# Patient Record
Sex: Female | Born: 1995 | Race: White | Hispanic: No | Marital: Single | Smoking: Never smoker
Health system: Southern US, Community
[De-identification: ages and names within clinical notes are randomized; demographics above are authoritative.]

## PROBLEM LIST (undated history)

## (undated) DIAGNOSIS — R519 Headache, unspecified: Secondary | ICD-10-CM

## (undated) DIAGNOSIS — Z789 Other specified health status: Secondary | ICD-10-CM

## (undated) HISTORY — DX: Other specified health status: Z78.9

---
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
## 2017-02-10 ENCOUNTER — Other Ambulatory Visit: Payer: Self-pay | Admitting: Certified Nurse Midwife
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
## 2017-03-26 ENCOUNTER — Other Ambulatory Visit: Payer: Self-pay

## 2017-03-26 ENCOUNTER — Telehealth: Payer: Self-pay

## 2017-03-26 MED ORDER — NORETHIN ACE-ETH ESTRAD-FE 1-20 MG-MCG(24) PO TABS: 1.0000 | ORAL_TABLET | Freq: Every day | ORAL | 1 refills | Status: DC

## 2017-03-26 NOTE — Telephone Encounter (Signed)
Left detailed msg that bc refill eRx'd. 

## 2017-03-26 NOTE — Telephone Encounter (Signed)
Pt called triage line late yesterday afternoon stating she needs an annual appointment.   Huntley DecSara please call patient and schedule.

## 2017-03-26 NOTE — Telephone Encounter (Signed)
Pt is schedule 04/09/17 with JC

## 2017-04-09 ENCOUNTER — Ambulatory Visit (INDEPENDENT_AMBULATORY_CARE_PROVIDER_SITE_OTHER): Payer: BC Managed Care – PPO | Admitting: Maternal Newborn

## 2017-04-09 ENCOUNTER — Encounter: Payer: Self-pay | Admitting: Maternal Newborn

## 2017-04-09 VITALS — BP 92/56 | HR 97 | Ht 66.0 in | Wt 114.0 lb

## 2017-04-09 DIAGNOSIS — Z3041 Encounter for surveillance of contraceptive pills: Secondary | ICD-10-CM | POA: Diagnosis not present

## 2017-04-09 DIAGNOSIS — Z113 Encounter for screening for infections with a predominantly sexual mode of transmission: Secondary | ICD-10-CM | POA: Diagnosis not present

## 2017-04-09 DIAGNOSIS — Z01419 Encounter for gynecological examination (general) (routine) without abnormal findings: Secondary | ICD-10-CM | POA: Diagnosis not present

## 2017-04-09 MED ORDER — NORETHIN ACE-ETH ESTRAD-FE 1-20 MG-MCG(24) PO TABS: 1.0000 | ORAL_TABLET | Freq: Every day | ORAL | 12 refills | Status: DC

## 2017-04-09 NOTE — Progress Notes (Signed)
Gynecology Annual Exam  PCP: Ronnette JuniperPringle, Joseph, MD  Chief Complaint:  Chief Complaint  Patient presents with  . Gynecologic Exam    History of Present Illness: Patient is a 21 y.o. G0P0000 presents for annual exam. The patient has no complaints today.   LMP: Patient's last menstrual period was 03/07/2017 (exact date). Menarche:16 Average Interval: regular, 28-30 days Duration of flow: 4 days Heavy Menses: no Clots: no Intermenstrual Bleeding: no Postcoital Bleeding: no Dysmenorrhea: no  The patient is sexually active with one female partner. She currently uses OCP (estrogen/progesterone) for contraception. She has dyspareunia.  The patient does not perform self breast exams.  There is no notable family history of breast or ovarian cancer in her family.  The patient wears seatbelts: yes.  The patient has regular exercise: yes.    The patient denies current symptoms of depression.    Review of Systems  Constitutional: Negative for chills, fever and malaise/fatigue.  HENT: Negative for congestion, ear pain and sinus pain.   Eyes: Negative for blurred vision, double vision and redness.  Respiratory: Negative for cough, shortness of breath and wheezing.   Cardiovascular: Negative for chest pain and palpitations.  Gastrointestinal: Negative for abdominal pain, constipation, diarrhea, heartburn and nausea.  Genitourinary: Negative for dysuria, frequency and urgency.  Musculoskeletal: Negative.   Skin: Negative.   Neurological: Negative for dizziness, sensory change and headaches.  Endo/Heme/Allergies: Negative.   Psychiatric/Behavioral: Negative.   All other systems reviewed and are negative.   Past Medical History:  Past Medical History:  Diagnosis Date  . No known health problems     Past Surgical History:  Past Surgical History:  Procedure Laterality Date  . WISDOM TOOTH EXTRACTION  05/2013    Gynecologic History:  Patient's last menstrual period was 03/07/2017  (exact date). Contraception: OCP (estrogen/progesterone) Last Pap: Not indicated due to <19 years of age.  Obstetric History: G0P0000  Family History:  Family History  Problem Relation Age of Onset  . Diabetes Maternal Grandmother        TYPE 2    Social History:  Social History   Socioeconomic History  . Marital status: Single    Spouse name: Not on file  . Number of children: 0  . Years of education: Not on file  . Highest education level: Not on file  Social Needs  . Financial resource strain: Not on file  . Food insecurity - worry: Not on file  . Food insecurity - inability: Not on file  . Transportation needs - medical: Not on file  . Transportation needs - non-medical: Not on file  Occupational History  . Not on file  Tobacco Use  . Smoking status: Never Smoker  . Smokeless tobacco: Never Used  Substance and Sexual Activity  . Alcohol use: No    Frequency: Never  . Drug use: No  . Sexual activity: Yes    Birth control/protection: Pill  Other Topics Concern  . Not on file  Social History Narrative  . Not on file    Allergies:  No Known Allergies  Medications: Prior to Admission medications   Medication Sig Start Date End Date Taking? Authorizing Provider  Norethindrone Acetate-Ethinyl Estrad-FE (BLISOVI 24 FE) 1-20 MG-MCG(24) tablet Take 1 tablet daily by mouth. 03/26/17  Yes Copland, Ilona SorrelAlicia B, PA-C    Physical Exam Vitals: Blood pressure (!) 92/56, pulse 97, height 5\' 6"  (1.676 m), weight 114 lb (51.7 kg), last menstrual period 03/07/2017.  General: NAD HEENT: normocephalic, anicteric Thyroid:  no enlargement, no palpable nodules Pulmonary: No increased work of breathing, CTAB Cardiovascular: RRR, distal pulses 2+ Breast: Breast symmetrical, no tenderness, no palpable nodules or masses, no skin or nipple retraction present, no nipple discharge.  No axillary or supraclavicular lymphadenopathy. Abdomen: NABS, soft, non-tender, non-distended.  Umbilicus  without lesions.  No hepatomegaly, splenomegaly or masses palpable. No evidence of hernia  Genitourinary:  External: Normal external female genitalia.  Normal urethral meatus, normal  Bartholin's and Skene's glands.    Vagina: Normal vaginal mucosa, no evidence of prolapse.    Cervix: Grossly normal in appearance, no bleeding  Uterus: Non-enlarged, mobile, normal contour.  No CMT  Adnexa: ovaries non-enlarged, no adnexal masses  Rectal: deferred  Lymphatic: no evidence of inguinal lymphadenopathy Extremities: no edema, erythema, or tenderness Neurologic: Grossly intact Psychiatric: mood appropriate, affect full  Assessment: 21 y.o. G0P0000, routine annual exam . Plan: Problem List Items Addressed This Visit    None    Visit Diagnoses    Encounter for annual routine gynecological examination    -  Primary   Surveillance for birth control, oral contraceptives       Screening for STDs (sexually transmitted diseases)       Relevant Orders   Chlamydia/Gonococcus/Trichomonas, NAA   HEP, RPR, HIV Panel   HSV(herpes smplx)abs-1+2(IgG+IgM)-bld      1) Dyspareunia - [place] out [place]; discussed that this can be normal with infrequent intercourse and advised on measures to increase comfort.  2) STI screening was offered and accepted.  3) Pap not indicated due to patient's age.  4) Contraception - Patient is interested in continuing her current form of contraception(combination oral contraceptives), Rx provided and she is aware of side effects. Has very light cycle on pills.  5) Follow up 1 year for routine annual exam.  Marcelyn BruinsJacelyn Starlina Lapre, CNM 04/09/2017  3:02 PM

## 2017-04-11 LAB — HEP, RPR, HIV PANEL
HEP B S AG: NEGATIVE
HIV SCREEN 4TH GENERATION: NONREACTIVE
RPR: NONREACTIVE

## 2017-04-11 LAB — HSV(HERPES SMPLX)ABS-I+II(IGG+IGM)-BLD
HSV 1 Glycoprotein G Ab, IgG: <0.91 index (ref 0.00–0.90)
HSVI/II Comb IgM: 1.3 Ratio — ABNORMAL HIGH (ref 0.00–0.90)

## 2017-04-12 LAB — CHLAMYDIA/GONOCOCCUS/TRICHOMONAS, NAA
Chlamydia by NAA: NEGATIVE
GONOCOCCUS BY NAA: NEGATIVE
TRICH VAG BY NAA: NEGATIVE

## 2017-04-18 ENCOUNTER — Telehealth: Payer: Self-pay | Admitting: Maternal Newborn

## 2017-04-18 NOTE — Telephone Encounter (Signed)
Pt is calling about her lab results. Please advise. Pt also has questions about aniexty

## 2017-04-19 NOTE — Telephone Encounter (Signed)
Left message for patient to call back to explain results.  Thanks, [place] BingJaci

## 2017-04-23 NOTE — Telephone Encounter (Signed)
Pt's mother Stacy Montes calling to request blood work results. She stated that pt has called twice. Mom's cb# [phone] mom is on HIPPA form. Thank you.

## 2017-04-24 NOTE — Telephone Encounter (Signed)
Called her and left a message; please see if you can transfer her to [place] SinkRita if she calls back and maybe she can find out a better time for me to return her call. Thanks, [place] BingJaci

## 2017-04-24 NOTE — Telephone Encounter (Signed)
Patient's phone number was incorrect in Epic. Was able to speak with patient this morning to review results.  Thanks,  Stacy Montes

## 2017-04-24 NOTE — Telephone Encounter (Signed)
Pt is calling to speak with JC about her labs she has additional questions please advise

## 2017-05-01 NOTE — Telephone Encounter (Signed)
Called pt to see when a good time would be for Jaci to call her back.  Pt states she no longer needs Jaci to call her back.  She was just paranoid about a cold sore.

## 2017-06-27 IMAGING — MR MR KNEE*R* W/O CM
6 series · 37 of 40 positions shown · non-contrast
Comparison: None.

CLINICAL DATA: Right knee twisting injury playing soccer 2 days ago
with onset of pain. Initial encounter.

EXAM:
MRI OF THE RIGHT KNEE WITHOUT CONTRAST
TECHNIQUE: Multiplanar, multisequence MR imaging of the knee was performed. No
intravenous contrast was administered.

[Series 3: PD fat-sat · axial · 3.0mm · 0.50mm/px · z∈[-51,+62]mm · 9 of 35 slices shown (1 of 4)]
[im 1/35]
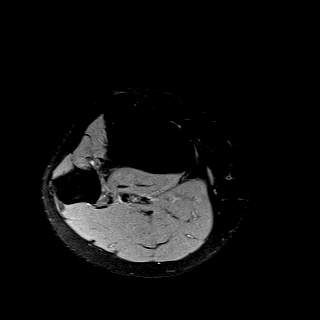
[im 5/35]
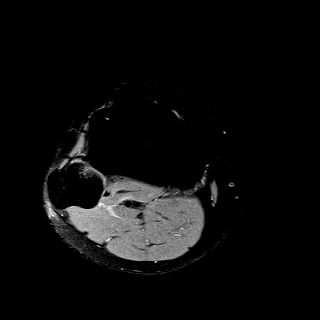
[im 9/35]
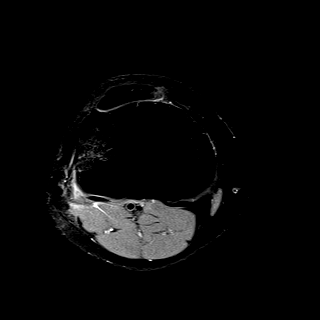
[im 13/35]
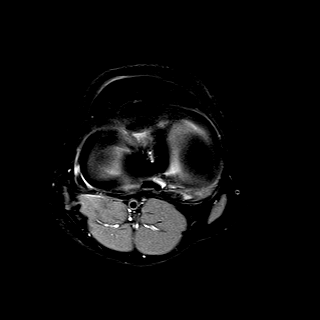
[im 18/35]
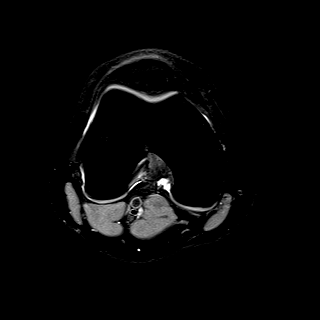
[im 22/35]
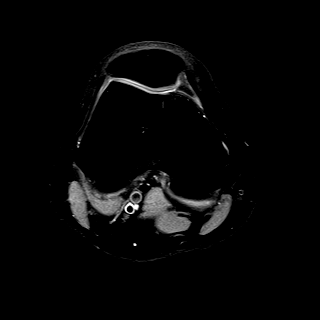
[im 26/35]
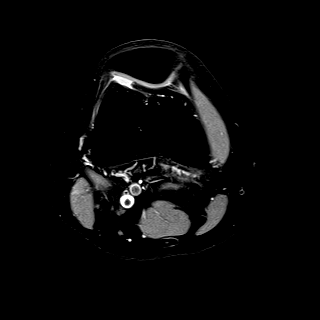
[im 30/35]
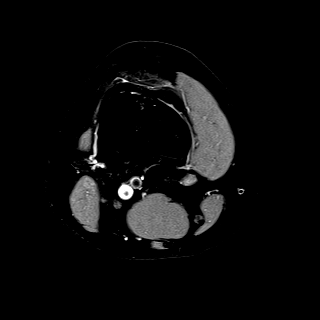
[im 35/35]
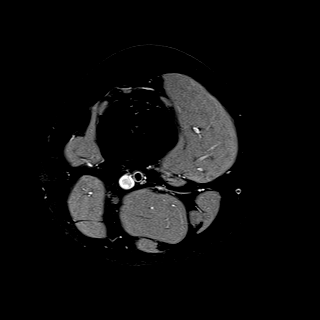

[Series 4: T1 · coronal · 3.0mm · 0.50mm/px · 4 of 27 slices shown]
[im 1/27]
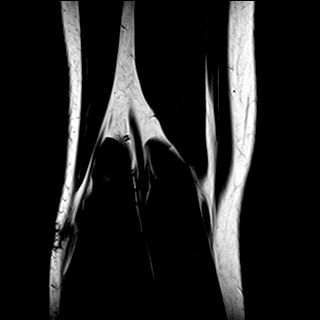
[im 5/27]
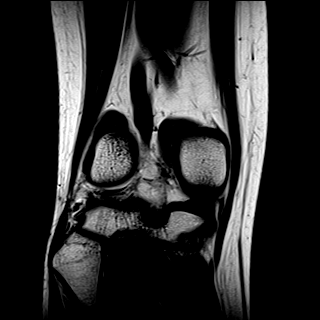
[im 9/27]
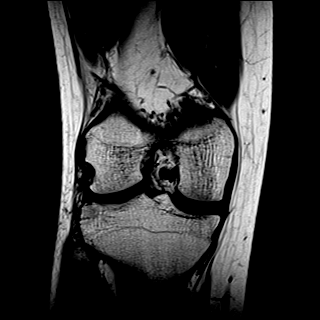
[im 14/27]
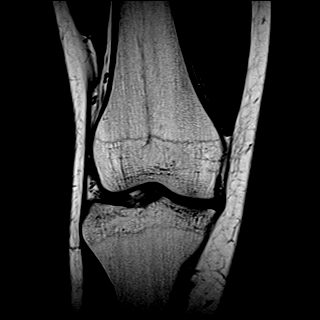

[Series 5: PD fat-sat · sagittal · 3.0mm · 0.50mm/px · 8 of 30 slices shown (2 of 4)]
[im 1/30]
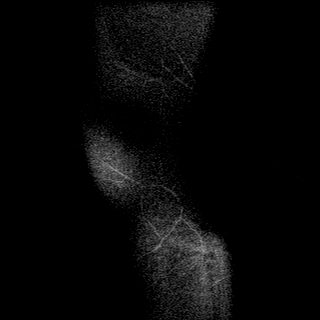
[im 5/30]
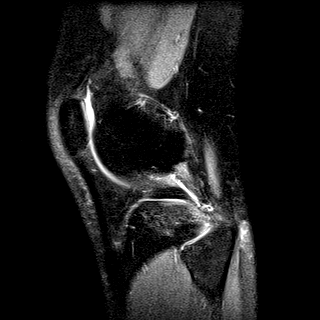
[im 9/30]
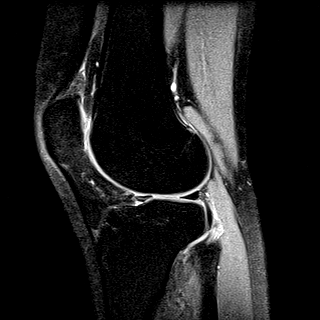
[im 13/30]
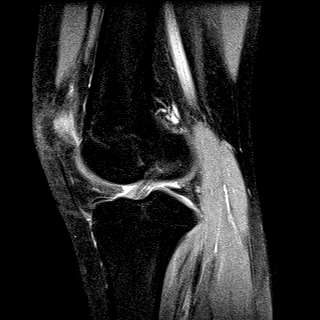
[im 17/30]
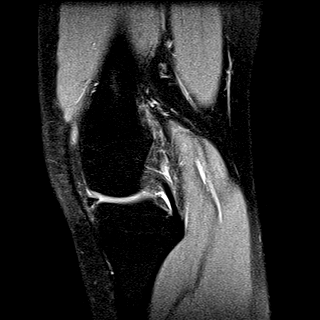
[im 21/30]
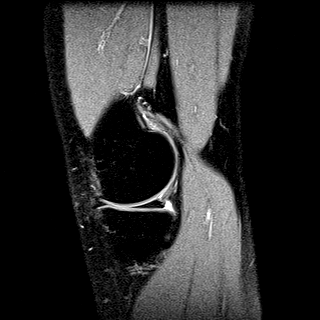
[im 25/30]
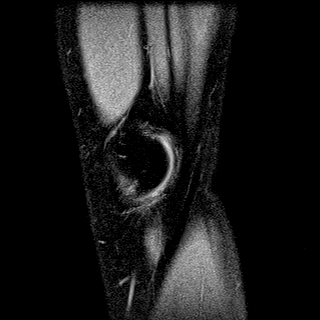
[im 30/30]
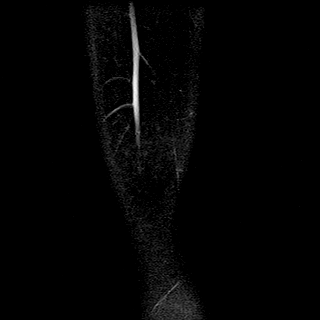

[Series 6: T2 fat-sat · coronal · 3.0mm · 0.31mm/px · 7 of 27 slices shown]
[im 1/27]
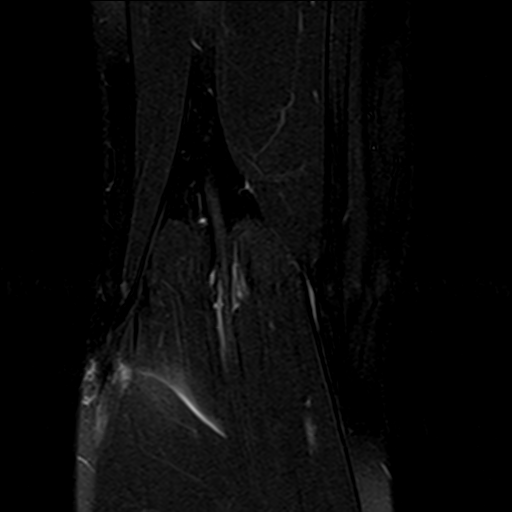
[im 5/27]
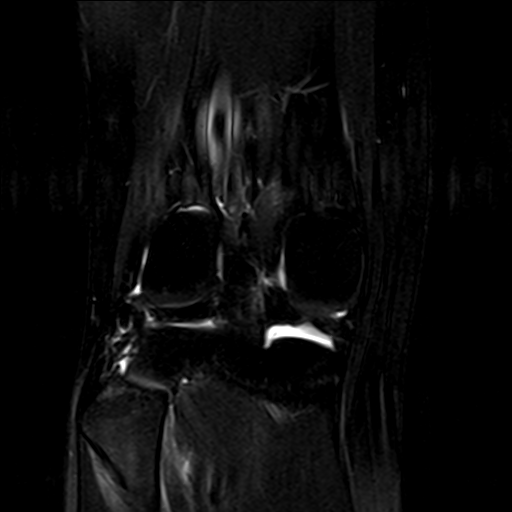
[im 9/27]
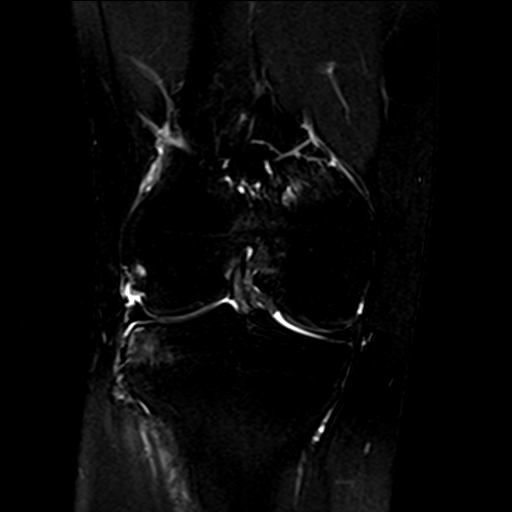
[im 14/27]
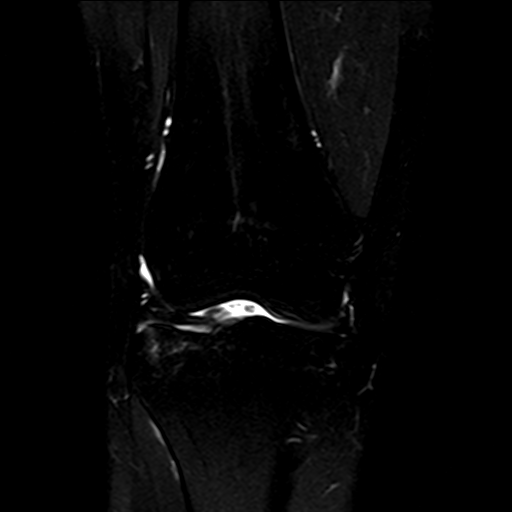
[im 18/27]
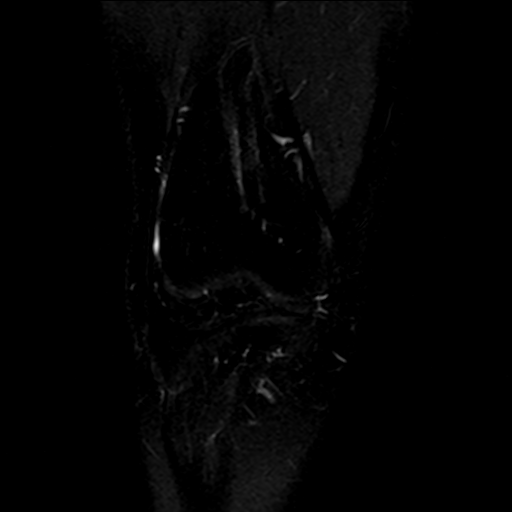
[im 22/27]
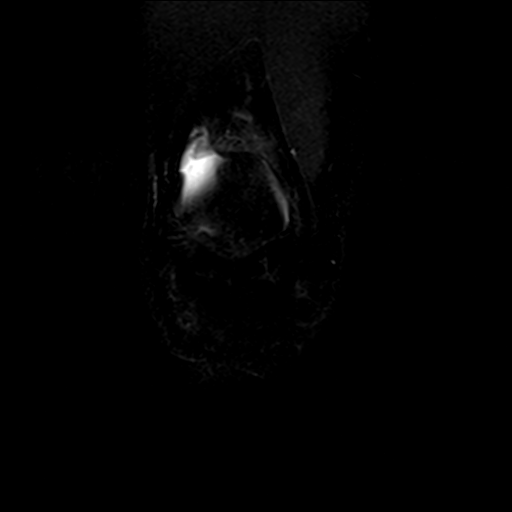
[im 27/27]
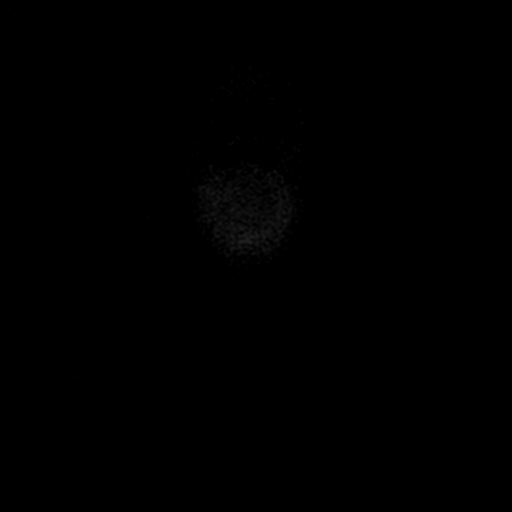

[Series 7: PD fat-sat · coronal · 3.0mm · 0.50mm/px · 7 of 27 slices shown (3 of 4)]
[im 1/27]
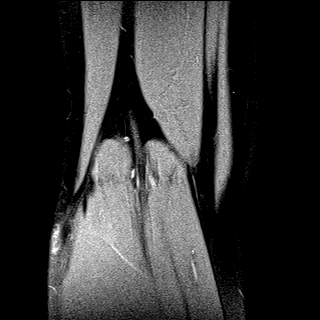
[im 5/27]
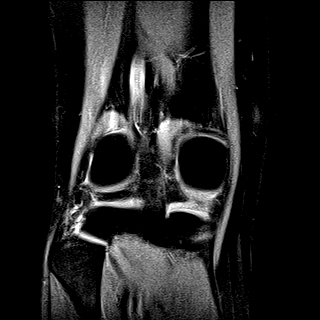
[im 9/27]
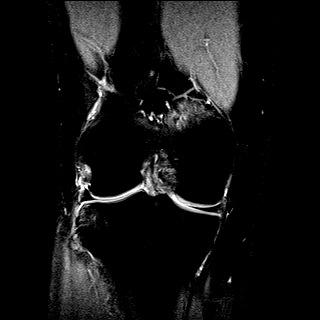
[im 14/27]
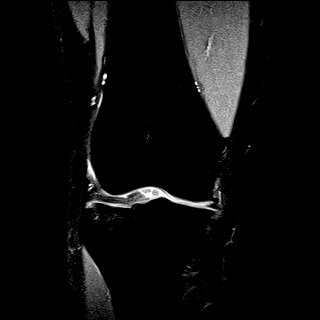
[im 18/27]
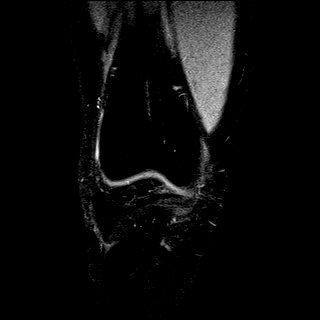
[im 22/27]
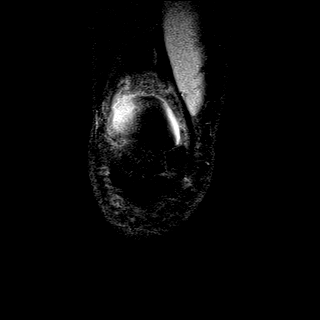
[im 27/27]
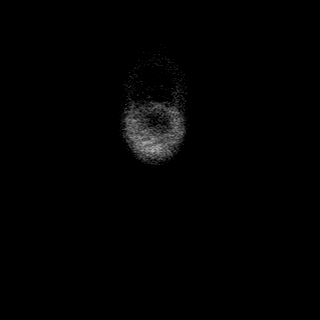

[Series 8: PD fat-sat · oblique · 2.0mm · 0.62mm/px · 2 of 7 slices shown (4 of 4)]
[im 1/7]
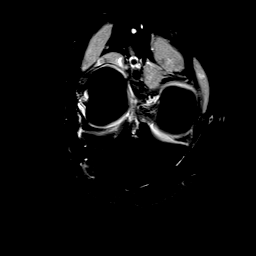
[im 7/7]
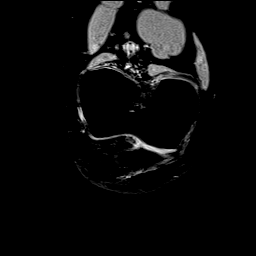

[37 of 40 positions shown; findings below may reference images not displayed]

FINDINGS: MENISCI

Medial meniscus:  Intact.

Lateral meniscus:  Intact.

LIGAMENTS

Cruciates:  Intact.

Collaterals:  Intact.

CARTILAGE

Patellofemoral:  Unremarkable.

Medial:  Unremarkable.

Lateral:  Unremarkable.

Joint:  Trace amount of joint fluid.

Popliteal Fossa:  No Baker's cyst.

Extensor Mechanism:  Intact.

Bones: Marrow edema is seen in the periphery of the lateral tibia
most consistent with bone contusion. No fracture is identified. Bone
marrow signal is otherwise normal.
IMPRESSION: Bone contusion lateral tibial plateau without fracture.

Negative for meniscal or ligament tear.

## 2018-01-23 ENCOUNTER — Telehealth: Payer: Self-pay

## 2018-01-23 NOTE — Telephone Encounter (Signed)
Patient is returning missed call. Please advise 

## 2018-01-23 NOTE — Telephone Encounter (Signed)
Adv to use backup until next period to be on the safe side.

## 2018-01-23 NOTE — Telephone Encounter (Signed)
Pt missed a pill in week 1 of pack, took it the next day, missed another pill on Tues of week 2.  Instructions say to use backup for 7d if missed consecutive days.  What to do?  [phone]  [facility].

## 2018-01-29 ENCOUNTER — Telehealth: Payer: Self-pay

## 2018-01-29 NOTE — Telephone Encounter (Signed)
Pt has been on Blisovi for 4-68yrs; c/o low sex drive. What to do?  [phone]

## 2018-01-31 NOTE — Telephone Encounter (Signed)
Pt calling back regarding decreased libido on birthcontrol. She said she called 2 days ago and no one has called her back. [phone] KJ CMA

## 2018-04-14 ENCOUNTER — Other Ambulatory Visit (HOSPITAL_COMMUNITY)
Admission: RE | Admit: 2018-04-14 | Discharge: 2018-04-14 | Disposition: A | Discharge status: Home / Self Care | Payer: BC Managed Care – PPO | Source: Ambulatory Visit | Attending: Maternal Newborn | Admitting: Maternal Newborn

## 2018-04-14 ENCOUNTER — Ambulatory Visit (INDEPENDENT_AMBULATORY_CARE_PROVIDER_SITE_OTHER): Payer: BC Managed Care – PPO | Admitting: Maternal Newborn

## 2018-04-14 ENCOUNTER — Encounter: Payer: Self-pay | Admitting: Maternal Newborn

## 2018-04-14 VITALS — BP 114/66 | HR 89 | Ht 66.5 in | Wt 116.0 lb

## 2018-04-14 DIAGNOSIS — Z113 Encounter for screening for infections with a predominantly sexual mode of transmission: Secondary | ICD-10-CM | POA: Insufficient documentation

## 2018-04-14 DIAGNOSIS — Z3041 Encounter for surveillance of contraceptive pills: Secondary | ICD-10-CM

## 2018-04-14 DIAGNOSIS — Z01419 Encounter for gynecological examination (general) (routine) without abnormal findings: Secondary | ICD-10-CM

## 2018-04-14 MED ORDER — NORETHIN ACE-ETH ESTRAD-FE 1-20 MG-MCG(24) PO TABS: ORAL_TABLET | ORAL | 11 refills | Status: DC

## 2018-04-14 NOTE — Progress Notes (Signed)
Gynecology Annual Exam  PCP: Ronnette JuniperPringle, Joseph, MD  Chief Complaint:  Chief Complaint  Patient presents with  . Gynecologic Exam    History of Present Illness: Patient is a 22 y.o. G0P0000 presenting for an annual exam. The patient has no complaints today.   LMP: No LMP recorded. (Menstrual status: Oral contraceptives). Menarche:16 Average Interval: irregular, has cycles every few months on OCP Duration of flow: a few days Heavy Menses: no Clots: no Intermenstrual Bleeding: no Postcoital Bleeding: no Dysmenorrhea: no  The patient is sexually active. She currently uses OCP for contraception. She has dyspareunia.  The patient does not perform self breast exams.  There is no notable family history of breast or ovarian cancer in her family.  The patient wears seatbelts: yes.  The patient has regular exercise: yes.    The patient denies current symptoms of depression.    Review of Systems  Constitutional: Positive for weight loss.  HENT: Positive for congestion and sore throat.   Eyes: Negative.   Respiratory: Positive for cough. Negative for shortness of breath and wheezing.   Cardiovascular: Negative for chest pain and palpitations.  Gastrointestinal: Negative for abdominal pain and heartburn.  Genitourinary: Negative.   Musculoskeletal: Negative.   Skin: Negative.   Neurological: Positive for dizziness.  Endo/Heme/Allergies: Positive for environmental allergies.  Psychiatric/Behavioral: Negative.   All other systems reviewed and are negative.   Past Medical History:  Past Medical History:  Diagnosis Date  . No known health problems     Past Surgical History:  Past Surgical History:  Procedure Laterality Date  . WISDOM TOOTH EXTRACTION  05/2013    Gynecologic History:  No LMP recorded. (Menstrual status: Oral contraceptives). Contraception: OCP (estrogen/progesterone) Last Pap: N/A, less than 20 years old.  Obstetric History: G0P0000  Family History:    Family History  Problem Relation Age of Onset  . Diabetes Maternal Grandmother        TYPE 2    Social History:  Social History   Socioeconomic History  . Marital status: Single    Spouse name: Not on file  . Number of children: 0  . Years of education: Not on file  . Highest education level: Not on file  Occupational History  . Not on file  Social Needs  . Financial resource strain: Not on file  . Food insecurity:    Worry: Not on file    Inability: Not on file  . Transportation needs:    Medical: Not on file    Non-medical: Not on file  Tobacco Use  . Smoking status: Never Smoker  . Smokeless tobacco: Never Used  Substance and Sexual Activity  . Alcohol use: No    Frequency: Never  . Drug use: No  . Sexual activity: Not Currently    Birth control/protection: Pill  Lifestyle  . Physical activity:    Days per week: 7 days    Minutes per session: 60 min  . Stress: Only a little  Relationships  . Social connections:    Talks on phone: More than three times a week    Gets together: More than three times a week    Attends religious service: More than 4 times per year    Active member of club or organization: Yes    Attends meetings of clubs or organizations: Never    Relationship status: Never married  . Intimate partner violence:    Fear of current or ex partner: No    Emotionally abused:  No    Physically abused: No    Forced sexual activity: No  Other Topics Concern  . Not on file  Social History Narrative  . Not on file    Allergies:  No Known Allergies  Medications: Prior to Admission medications   Medication Sig Start Date End Date Taking? Authorizing Provider  Norethindrone Acetate-Ethinyl Estrad-FE (BLISOVI 24 FE) 1-20 MG-MCG(24) tablet TAKE 1 TABLET BY MOUTH EVERY DAY .START THE FIRST PILL PACK ON THE FIRST DAY OF YOUR NEXT PERIOD. 10/17/16  Yes [provider]    Physical Exam Vitals: Blood pressure 114/66, pulse 89, height 5' 6.5"  (1.689 m), weight 116 lb (52.6 kg).  General: NAD HEENT: normocephalic, anicteric Thyroid: no enlargement Pulmonary: No increased work of breathing, CTAB Cardiovascular: RRR Breast: Breasts symmetrical, no tenderness, no palpable nodules or masses, no skin or nipple retraction present, no nipple discharge.  No axillary or supraclavicular lymphadenopathy. Abdomen: Soft, non-tender, non-distended.  Umbilicus without lesions.  No hepatomegaly, splenomegaly or masses palpable. No evidence of hernia  Genitourinary:  External: Normal external female genitalia.  Normal urethral  meatus, normal Bartholin's and Skene's glands.    Vagina: Normal vaginal mucosa, no evidence of prolapse.    Cervix: Grossly normal in appearance, no bleeding  Uterus: Non-enlarged, mobile, normal contour.  No CMT  Adnexa: ovaries non-enlarged, no adnexal masses  Rectal: deferred  Lymphatic: no evidence of inguinal lymphadenopathy Extremities: no edema, erythema, or tenderness Neurologic: Grossly intact Psychiatric: mood appropriate, affect full  Assessment: 22 y.o. G0P0000 routine annual exam  Plan: Problem List Items Addressed This Visit    None    Visit Diagnoses    Women's annual routine gynecological examination    -  Primary   Screen for STD (sexually transmitted disease)       Relevant Orders   Cervicovaginal ancillary only   HEP, RPR, HIV Panel   HSV(herpes smplx)abs-1+2(IgG+IgM)-bld      1) STI screening was offered and accepted  2) Begin Pap screening next year at age 51.  89) Contraception -  Patient is interested in continuing her current form of contraception. Occasional cycles with pills and satisfied overall with this method; no adverse effects.  4) Follow up 1 year for routine annual exam.  Marcelyn Bruins, CNM 04/14/2018  4:05 PM

## 2018-04-16 LAB — HSV(HERPES SMPLX)ABS-I+II(IGG+IGM)-BLD
HSV 1 Glycoprotein G Ab, IgG: <0.91 index (ref 0.00–0.90)
HSVI/II Comb IgM: 1.14 Ratio — ABNORMAL HIGH (ref 0.00–0.90)

## 2018-04-16 LAB — HEP, RPR, HIV PANEL
HIV Screen 4th Generation wRfx: NONREACTIVE
Hepatitis B Surface Ag: NEGATIVE
RPR: NONREACTIVE

## 2018-04-16 LAB — CERVICOVAGINAL ANCILLARY ONLY
CHLAMYDIA, DNA PROBE: NEGATIVE
NEISSERIA GONORRHEA: NEGATIVE
Trichomonas: NEGATIVE

## 2019-01-29 ENCOUNTER — Other Ambulatory Visit: Payer: Self-pay | Admitting: Maternal Newborn

## 2019-01-29 DIAGNOSIS — Z3041 Encounter for surveillance of contraceptive pills: Secondary | ICD-10-CM

## 2019-04-06 ENCOUNTER — Other Ambulatory Visit: Payer: Self-pay | Admitting: Maternal Newborn

## 2019-04-06 DIAGNOSIS — Z3041 Encounter for surveillance of contraceptive pills: Secondary | ICD-10-CM

## 2019-04-21 NOTE — Patient Instructions (Signed)
I value your feedback and entrusting us with your care. If you get a [facility] patient survey, I would appreciate you taking the time to let us know about your experience today. Thank you! 

## 2019-04-21 NOTE — Progress Notes (Signed)
PCP:  Ronnette JuniperPringle, Joseph, MD   Chief Complaint  Patient presents with  . Gynecologic Exam     HPI:      [name] is a 23 y.o. G0P0000 who LMP was Patient's last menstrual period was 04/13/2019 (approximate)., presents today for her annual examination.  Her menses are absent on OCPs.  Dysmenorrhea during placebo pills, no meds needed. She does have intermenstrual bleeding with late/missed pills.  Sex activity: not sexually active currently, but was in past with dyspareunia.  Last Pap: N/A due to age Hx of STDs: none  There is no FH of breast cancer. There is no FH of ovarian cancer. The patient does not do self-breast exams.  Tobacco use: The patient denies current or previous tobacco use. Alcohol use: social drinker No drug use.  Exercise: moderately active  She does get adequate calcium but not Vitamin D in her diet. Thinks she had Gardasil vaccine.   There are no active problems to display for this patient.   Past Surgical History:  Procedure Laterality Date  . WISDOM TOOTH EXTRACTION  05/2013    Family History  Problem Relation Age of Onset  . Diabetes Maternal Grandmother        TYPE 2    Social History   Socioeconomic History  . Marital status: Single    Spouse name: Not on file  . Number of children: 0  . Years of education: Not on file  . Highest education level: Not on file  Occupational History  . Not on file  Social Needs  . Financial resource strain: Not on file  . Food insecurity    Worry: Not on file    Inability: Not on file  . Transportation needs    Medical: Not on file    Non-medical: Not on file  Tobacco Use  . Smoking status: Never Smoker  . Smokeless tobacco: Never Used  Substance and Sexual Activity  . Alcohol use: No    Frequency: Never  . Drug use: No  . Sexual activity: Not Currently    Birth control/protection: Pill  Lifestyle  . Physical activity    Days per week: 7 days    Minutes per session: 60 min  .  Stress: Only a little  Relationships  . Social connections    Talks on phone: More than three times a week    Gets together: More than three times a week    Attends religious service: More than 4 times per year    Active member of club or organization: Yes    Attends meetings of clubs or organizations: Never    Relationship status: Never married  . Intimate partner violence    Fear of current or ex partner: No    Emotionally abused: No    Physically abused: No    Forced sexual activity: No  Other Topics Concern  . Not on file  Social History Narrative  . Not on file     Current Outpatient Medications:  .  mupirocin ointment (BACTROBAN) 2 %, APPLY A SMALL AMOUNT TO AFFECTED AREA ONCE A DAY WITH DRESSING CHANGES, Disp: , Rfl:  .  Norethindrone Acetate-Ethinyl Estrad-FE (BLISOVI 24 FE) 1-20 MG-MCG(24) tablet, TAKE 1 TABLET BY MOUTH DAILY, Disp: 84 tablet, Rfl: 3     ROS:  Review of Systems  Constitutional: Negative for fatigue, fever and unexpected weight change.  Respiratory: Negative for cough, shortness of breath and wheezing.   Cardiovascular: Negative for chest pain,  palpitations and leg swelling.  Gastrointestinal: Negative for blood in stool, constipation, diarrhea, nausea and vomiting.  Endocrine: Negative for cold intolerance, heat intolerance and polyuria.  Genitourinary: Negative for dyspareunia, dysuria, flank pain, frequency, genital sores, hematuria, menstrual problem, pelvic pain, urgency, vaginal bleeding, vaginal discharge and vaginal pain.  Musculoskeletal: Negative for back pain, joint swelling and myalgias.  Skin: Negative for rash.  Neurological: Negative for dizziness, syncope, light-headedness, numbness and headaches.  Hematological: Negative for adenopathy.  Psychiatric/Behavioral: Negative for agitation, confusion, sleep disturbance and suicidal ideas. The patient is not nervous/anxious.    BREAST: No symptoms   Objective: BP 102/70   Ht 5\' 7"   (1.702 m)   Wt 117 lb (53.1 kg)   LMP 04/13/2019 (Approximate)   BMI 18.32 kg/m    Physical Exam Constitutional:      Appearance: She is well-developed.  Genitourinary:     Vulva, vagina, cervix, uterus, right adnexa and left adnexa normal.     No vulval lesion or tenderness noted.     No vaginal discharge, erythema or tenderness.     No cervical polyp.     Uterus is not enlarged or tender.     No right or left adnexal mass present.     Right adnexa not tender.     Left adnexa not tender.  Neck:     Musculoskeletal: Normal range of motion.     Thyroid: No thyromegaly.  Cardiovascular:     Rate and Rhythm: Normal rate and regular rhythm.     Heart sounds: Normal heart sounds. No murmur.  Pulmonary:     Effort: Pulmonary effort is normal.     Breath sounds: Normal breath sounds.  Chest:     Breasts:        Right: No mass, nipple discharge, skin change or tenderness.        Left: No mass, nipple discharge, skin change or tenderness.  Abdominal:     Palpations: Abdomen is soft.     Tenderness: There is no abdominal tenderness. There is no guarding.  Musculoskeletal: Normal range of motion.  Neurological:     General: No focal deficit present.     Mental Status: She is alert and oriented to person, place, and time.     Cranial Nerves: No cranial nerve deficit.  Skin:    General: Skin is warm and dry.  Psychiatric:        Mood and Affect: Mood normal.        Behavior: Behavior normal.        Thought Content: Thought content normal.        Judgment: Judgment normal.  Vitals signs reviewed.     Assessment/Plan: Encounter for annual routine gynecological examination  Cervical cancer screening - Plan: [facility] PAP  Screening for STD (sexually transmitted disease) - Plan: [facility] PAP  Encounter for surveillance of contraceptive pills - Plan: Norethindrone Acetate-Ethinyl Estrad-FE (BLISOVI 24 FE) 1-20 MG-MCG(24) tablet; OCP RF    Meds ordered this encounter  Medications  .  Norethindrone Acetate-Ethinyl Estrad-FE (BLISOVI 24 FE) 1-20 MG-MCG(24) tablet    Sig: TAKE 1 TABLET BY MOUTH DAILY    Dispense:  84 tablet    Refill:  3    Order Specific Question:   Supervising Provider    Answer:   04/15/2019 Nadara Mustard             GYN counsel adequate intake of calcium and vitamin D, diet and exercise, Gardasil--pt to confirm it was  completed.      F/U  Return in about 1 year (around 04/21/2020).  Lynasia Meloche B. Jenniferlynn Saad, PA-C 04/22/2019 10:26 AM

## 2019-04-22 ENCOUNTER — Other Ambulatory Visit (HOSPITAL_COMMUNITY)
Admission: RE | Admit: 2019-04-22 | Discharge: 2019-04-22 | Disposition: A | Discharge status: Home / Self Care | Payer: BC Managed Care – PPO | Source: Ambulatory Visit | Attending: Obstetrics and Gynecology | Admitting: Obstetrics and Gynecology

## 2019-04-22 ENCOUNTER — Ambulatory Visit (INDEPENDENT_AMBULATORY_CARE_PROVIDER_SITE_OTHER): Payer: BC Managed Care – PPO | Admitting: Obstetrics and Gynecology

## 2019-04-22 ENCOUNTER — Other Ambulatory Visit: Payer: Self-pay

## 2019-04-22 ENCOUNTER — Ambulatory Visit: Payer: BC Managed Care – PPO | Admitting: Maternal Newborn

## 2019-04-22 ENCOUNTER — Encounter: Payer: Self-pay | Admitting: Obstetrics and Gynecology

## 2019-04-22 VITALS — BP 102/70 | Ht 67.0 in | Wt 117.0 lb

## 2019-04-22 DIAGNOSIS — Z113 Encounter for screening for infections with a predominantly sexual mode of transmission: Secondary | ICD-10-CM

## 2019-04-22 DIAGNOSIS — Z124 Encounter for screening for malignant neoplasm of cervix: Secondary | ICD-10-CM | POA: Insufficient documentation

## 2019-04-22 DIAGNOSIS — Z01419 Encounter for gynecological examination (general) (routine) without abnormal findings: Secondary | ICD-10-CM | POA: Diagnosis not present

## 2019-04-22 DIAGNOSIS — Z3041 Encounter for surveillance of contraceptive pills: Secondary | ICD-10-CM

## 2019-04-22 MED ORDER — BLISOVI 24 FE 1-20 MG-MCG(24) PO TABS: ORAL_TABLET | ORAL | 3 refills | Status: DC

## 2019-04-27 LAB — CYTOLOGY - PAP
Chlamydia: NEGATIVE
Comment: NEGATIVE
Comment: NORMAL
Neisseria Gonorrhea: NEGATIVE

## 2019-08-25 ENCOUNTER — Other Ambulatory Visit: Payer: Self-pay | Admitting: Dermatology

## 2019-10-22 ENCOUNTER — Encounter: Payer: Self-pay | Admitting: Obstetrics and Gynecology

## 2019-12-30 ENCOUNTER — Other Ambulatory Visit: Payer: Self-pay

## 2019-12-30 ENCOUNTER — Ambulatory Visit: Payer: BC Managed Care – PPO | Admitting: Dermatology

## 2019-12-30 ENCOUNTER — Encounter: Payer: Self-pay | Admitting: Dermatology

## 2019-12-30 DIAGNOSIS — L814 Other melanin hyperpigmentation: Secondary | ICD-10-CM | POA: Diagnosis not present

## 2019-12-30 DIAGNOSIS — L578 Other skin changes due to chronic exposure to nonionizing radiation: Secondary | ICD-10-CM | POA: Diagnosis not present

## 2019-12-30 DIAGNOSIS — B078 Other viral warts: Secondary | ICD-10-CM | POA: Diagnosis not present

## 2019-12-30 MED ORDER — IMIQUIMOD 5 % EX CREA: TOPICAL_CREAM | CUTANEOUS | 0 refills | Status: DC

## 2019-12-30 MED ORDER — IMIQUIMOD 3.75 % EX CREA: TOPICAL_CREAM | CUTANEOUS | 1 refills | Status: DC

## 2019-12-30 NOTE — Patient Instructions (Addendum)
Recommend daily broad spectrum sunscreen SPF 70+ to sun-exposed areas, reapply every 2 hours as needed. Call for new or changing lesions. Recommend Alastin and [employer]  Recommend taking Heliocare sun protection supplement daily in sunny weather for additional sun protection. For maximum protection on the sunniest days, you can take up to 2 capsules of regular Heliocare OR take 1 capsule of Heliocare Ultra. For prolonged exposure (such as a full day in the sun), you can repeat your dose of the supplement 4 hours after your first dose. Heliocare can be purchased at [facility] or at [email or web address].   Discussed viral etiology and risk of spread.  Discussed multiple treatments may be required to clear warts.  Discussed possible post-treatment dyspigmentation and risk of recurrence.  Cantharidin is a blistering agent that comes from a beetle.  It needs to be washed off in about 4 hours after application.  Although it is painless when applied in office, it may cause symptoms of mild pain and burning several hours later.  Treated areas will swell and turn red, and blisters may form.  Vaseline and a bandaid may be applied until wound has healed.  Once healed, the skin may remain temporarily discolored.  It can take weeks to months for pigmentation to return to normal.  The molluscum may resolve with this topical treatment, but often, additional treatments may be required to clear molluscum.  It is recommended to keep the skin well-moisturized and avoid scratching affected area to help prevent spread of the molluscum.  Instructions for After In-Office Application of Cantharidin  1. This is a strong medicine; please follow ALL instructions.  2. Gently wash off with soap and water in four hours or sooner s directed by your physician.  3. **WARNING** this medicine can cause severe blistering, blood blisters, infection, and/or scarring if it is not washed off as directed.  4. Your progress will  be rechecked in 1-2 months; call sooner if there are any questions or problems.

## 2019-12-30 NOTE — Progress Notes (Signed)
   Follow-Up Visit   Subjective  Stacy Montes is a 24 y.o. female who presents for the following: area of concern.  Patient presents today for some areas of concern on her right leg, present for past 3 years asymptomatic, tend to spread when she shaves over them  The following portions of the chart were reviewed this encounter and updated as appropriate:  Tobacco  Allergies  Meds  Problems  Med Hx  Surg Hx  Fam Hx      Review of Systems:  No other skin or systemic complaints except as noted in HPI or Assessment and Plan.  Objective  Well appearing patient in no apparent distress; mood and affect are within normal limits.  A focused examination was performed including Right legs. Relevant physical exam findings are noted in the Assessment and Plan.  Objective  Right Knee - Anterior: Verrucous papules    Assessment & Plan  Other viral warts Right Knee - Anterior  Discussed viral etiology and risk of spread.  Discussed multiple treatments may be required to clear warts.  Discussed possible post-treatment dyspigmentation and risk of recurrence.   Treated today w/ Catherone plus  Cantharidin is a blistering agent that comes from a beetle.  It needs to be washed off in about 4 hours after application.  Although it is painless when applied in office, it may cause symptoms of mild pain and burning several hours later.  Treated areas will swell and turn red, and blisters may form.  Vaseline and a bandaid may be applied until wound has healed.  Once healed, the skin may remain temporarily discolored.  It can take weeks to months for pigmentation to return to normal.  The molluscum may resolve with this topical treatment, but often, additional treatments may be required to clear molluscum.  It is recommended to keep the skin well-moisturized and avoid scratching affected area to help prevent spread of the molluscum.   Start Imiquimod 5% cream apply to warts only up to once daily for  1 month. Avoid surrounding area, decrease frequency if getting irritated. Reviewed expected irritation.   Destruction of lesion - Right Knee - Anterior  Destruction method: chemical removal   Informed consent: discussed and consent obtained   Timeout:  patient name, date of birth, surgical site, and procedure verified Chemical destruction method: cantharidin   Procedure instructions: patient instructed to wash and dry area   Outcome: patient tolerated procedure well with no complications   Post-procedure details: wound care instructions given   Additional details:  Wash area off in 4 to 6 hours  imiquimod (ALDARA) 5 % cream - Right Knee - Anterior  Lentigines - Scattered tan macules - Discussed due to sun exposure - Benign, observe - Call for any changes  Actinic Damage - diffuse scaly erythematous macules with underlying dyspigmentation - Recommend daily broad spectrum sunscreen SPF 30+ to sun-exposed areas, reapply every 2 hours as needed.  - Call for new or changing lesions.  Return in about 1 month (around 01/30/2020) for Wart.  ILeward Quan, CMA, am acting as scribe for Darden Dates, MD .  Documentation: I have reviewed the above documentation for accuracy and completeness, and I agree with the above.  Darden Dates, MD

## 2020-01-10 ENCOUNTER — Encounter: Payer: Self-pay | Admitting: Dermatology

## 2020-02-03 ENCOUNTER — Other Ambulatory Visit: Payer: Self-pay

## 2020-02-03 ENCOUNTER — Ambulatory Visit: Payer: BC Managed Care – PPO | Admitting: Dermatology

## 2020-02-03 DIAGNOSIS — B078 Other viral warts: Secondary | ICD-10-CM | POA: Diagnosis not present

## 2020-02-03 DIAGNOSIS — B079 Viral wart, unspecified: Secondary | ICD-10-CM | POA: Diagnosis not present

## 2020-02-03 DIAGNOSIS — L710 Perioral dermatitis: Secondary | ICD-10-CM | POA: Diagnosis not present

## 2020-02-03 NOTE — Progress Notes (Signed)
   Follow-Up Visit   Subjective  Stacy Montes is a 24 y.o. female who presents for the following: Follow-up.  Patient presents today for follow up on OV 12/30/19 for warts, patient also has concern (rash) on her face around nose.  She has previously used doxycycline for facial rash and tolerated well.  Patient has been using Imiquimod cream for warts.  The following portions of the chart were reviewed this encounter and updated as appropriate:  Tobacco  Allergies  Meds  Problems  Med Hx  Surg Hx  Fam Hx      Review of Systems:  No other skin or systemic complaints except as noted in HPI or Assessment and Plan.  Objective  Well appearing patient in no apparent distress; mood and affect are within normal limits.  A focused examination was performed including Right leg and Face. Relevant physical exam findings are noted in the Assessment and Plan.  Objective  Right extremity (20):  20 Verrucous papules  Objective  Oral Commissures: Erythematous papules around nose and mouth.   Assessment & Plan  Viral warts, unspecified type (20) Right extremity  Continue Imiquimod cream to warts qhs.  Reviewed expected irritation. Treated today with Catharone plus today to 20 warts.  Reviewed that this must be washed off with soap and water in 4 hours or sooner if she has any tenderness before that time.  Discussed viral etiology and risk of spread.  Discussed multiple treatments may be required to clear warts.  Discussed possible post-treatment dyspigmentation and risk of recurrence.   Destruction of lesion - Right extremity  Destruction method: chemical removal   Informed consent: discussed and consent obtained   Timeout:  patient name, date of birth, surgical site, and procedure verified Chemical destruction method: cantharidin   Procedure instructions: patient instructed to wash and dry area   Outcome: patient tolerated procedure well with no complications   Post-procedure  details: wound care instructions given   Additional details:  Wash off in 4 hours with soap and water  Perioral dermatitis Oral Commissures  Start Doxycycline 20mg  BID until clear for 2 months, then decrease to once per day if clear.   Doxycycline should be taken with food to prevent nausea. Do not lay down for 30 minutes after taking. Be cautious with sun exposure and use good sun protection while on this medication. Pregnant women should not take this medication.    doxycycline (PERIOSTAT) 20 MG tablet - Oral Commissures  Other viral warts  Reordered Medications imiquimod (ALDARA) 5 % cream  Return in about 1 month (around 03/04/2020) for Wart / perioral derm.  I10/17/2021, CMA, am acting as scribe for Leward Quan, MD .  Documentation: I have reviewed the above documentation for accuracy and completeness, and I agree with the above.  Darden Dates, MD

## 2020-02-03 NOTE — Patient Instructions (Addendum)
Recommend daily broad spectrum sunscreen SPF 30+ to sun-exposed areas, reapply every 2 hours as needed. Call for new or changing lesions.  Discussed viral etiology and risk of spread.  Discussed multiple treatments may be required to clear warts.  Discussed possible post-treatment dyspigmentation and risk of recurrence.  Cantharidin is a blistering agent that comes from a beetle.  It needs to be washed off in about 4 hours after application.  Although it is painless when applied in office, it may cause symptoms of mild pain and burning several hours later.  Treated areas will swell and turn red, and blisters may form.  Vaseline and a bandaid may be applied until wound has healed.  Once healed, the skin may remain temporarily discolored.  It can take weeks to months for pigmentation to return to normal.  The molluscum may resolve with this topical treatment, but often, additional treatments may be required to clear molluscum.  It is recommended to keep the skin well-moisturized and avoid scratching affected area to help prevent spread of the molluscum.  Instructions for After In-Office Application of Cantharidin  1. This is a strong medicine; please follow ALL instructions.  2. Gently wash off with soap and water in four hours or sooner s directed by your physician.  3. **WARNING** this medicine can cause severe blistering, blood blisters, infection, and/or scarring if it is not washed off as directed.  4. Your progress will be rechecked in 1-2 months; call sooner if there are any questions or problems.  

## 2020-02-04 MED ORDER — IMIQUIMOD 5 % EX CREA: TOPICAL_CREAM | CUTANEOUS | 0 refills | Status: DC

## 2020-02-04 MED ORDER — DOXYCYCLINE HYCLATE 20 MG PO TABS: 20.0000 mg | ORAL_TABLET | Freq: Two times a day (BID) | ORAL | 3 refills | Status: DC

## 2020-02-15 ENCOUNTER — Encounter: Payer: Self-pay | Admitting: Dermatology

## 2020-02-15 MED ORDER — DOXYCYCLINE HYCLATE 20 MG PO TABS: 20.0000 mg | ORAL_TABLET | Freq: Two times a day (BID) | ORAL | 3 refills | Status: DC

## 2020-02-25 ENCOUNTER — Ambulatory Visit: Payer: BC Managed Care – PPO | Admitting: Dermatology

## 2020-03-10 ENCOUNTER — Ambulatory Visit: Payer: BC Managed Care – PPO | Admitting: Dermatology

## 2020-03-10 ENCOUNTER — Other Ambulatory Visit: Payer: Self-pay

## 2020-03-10 ENCOUNTER — Encounter: Payer: Self-pay | Admitting: Dermatology

## 2020-03-10 DIAGNOSIS — B079 Viral wart, unspecified: Secondary | ICD-10-CM | POA: Diagnosis not present

## 2020-03-10 DIAGNOSIS — B078 Other viral warts: Secondary | ICD-10-CM | POA: Diagnosis not present

## 2020-03-10 DIAGNOSIS — L710 Perioral dermatitis: Secondary | ICD-10-CM | POA: Diagnosis not present

## 2020-03-10 MED ORDER — IMIQUIMOD 5 % EX CREA: TOPICAL_CREAM | CUTANEOUS | 0 refills | Status: DC

## 2020-03-10 MED ORDER — DOXYCYCLINE HYCLATE 20 MG PO TABS: 20.0000 mg | ORAL_TABLET | Freq: Every day | ORAL | 0 refills | Status: AC

## 2020-03-10 NOTE — Progress Notes (Signed)
   Follow-Up Visit   Subjective  Stacy Montes is a 24 y.o. female who presents for the following: Follow-up (OV 02/03/20).  Patient presents today for follow up on OV 02/03/20 for Wart and perioral derm. Patient is using Imiquimod cream on warts, Doxycycline 20 mg for perioral derm. Patient states that she believes both have resolved. Warts were treated last visit with Catherone plus  The following portions of the chart were reviewed this encounter and updated as appropriate:  Tobacco  Allergies  Meds  Problems  Med Hx  Surg Hx  Fam Hx      Review of Systems:  No other skin or systemic complaints except as noted in HPI or Assessment and Plan.  Objective  Well appearing patient in no apparent distress; mood and affect are within normal limits.  A focused examination was performed including Face, and Right knee. Relevant physical exam findings are noted in the Assessment and Plan.  Objective  lower face: Clear today  Objective  B/L dorsal hand x 2 (2), Right Leg x 30 (30): Verrucous papules -- Discussed viral etiology and contagion.    Assessment & Plan  Perioral dermatitis lower face  She has been clear for 1-2 weeks. Recommend about 2 more weeks of doxycycline at just once a day dosing to help prevent recurrence. She is tolerating it well.  Continue Doxycycline 20 mg QD with food  Doxycycline should be taken with food to prevent nausea. Do not lay down for 30 minutes after taking. Be cautious with sun exposure and use good sun protection while on this medication. Pregnant women should not take this medication.   Reordered Medications doxycycline (PERIOSTAT) 20 MG tablet  Viral warts, unspecified type (32) B/L dorsal hand x 2 (2); Right Leg x 30 (30)  Discussed viral etiology and risk of spread.  Discussed multiple treatments may be required to clear warts.  Discussed possible post-treatment dyspigmentation and risk of recurrence.  Restart imiquimod qhs directly to  residual warts once they are no longer inflamed from cantharone plus Advised postinflammatory hypopigmentation will resolve with time. Recommend broad spectrum sunscreen SPF 30+ to affected areas  Destruction of lesion - Right Leg x 30  Destruction method: chemical removal   Destruction method comment:  Cantharone plus Informed consent: discussed and consent obtained   Timeout:  patient name, date of birth, surgical site, and procedure verified Chemical destruction method: cantharidin   Procedure instructions: patient instructed to wash and dry area   Outcome: patient tolerated procedure well with no complications   Post-procedure details: wound care instructions given   Additional details:  Wash area off in 4 hours with soap and water. Wash off sooner if she has any tenderness before then.  Other viral warts  Reordered Medications imiquimod (ALDARA) 5 % cream  Return in about 1 month (around 04/10/2020) for Wart. ILeward Quan, CMA, am acting as scribe for Darden Dates, MD .  Documentation: I have reviewed the above documentation for accuracy and completeness, and I agree with the above.  Darden Dates, MD  Documentation: I have reviewed the above documentation for accuracy and completeness, and I agree with the above.  Darden Dates, MD

## 2020-03-10 NOTE — Patient Instructions (Signed)
Cantharidin is a blistering agent that comes from a beetle.  It needs to be washed off in about 4 hours after application.  Although it is painless when applied in office, it may cause symptoms of mild pain and burning several hours later.  Treated areas will swell and turn red, and blisters may form.  Vaseline and a bandaid may be applied until wound has healed.  Once healed, the skin may remain temporarily discolored.  It can take weeks to months for pigmentation to return to normal.  The molluscum may resolve with this topical treatment, but often, additional treatments may be required to clear molluscum.  It is recommended to keep the skin well-moisturized and avoid scratching affected area to help prevent spread of the molluscum.  Instructions for After In-Office Application of Cantharidin  1. This is a strong medicine; please follow ALL instructions.  2. Gently wash off with soap and water in four hours or sooner s directed by your physician.  3. **WARNING** this medicine can cause severe blistering, blood blisters, infection, and/or scarring if it is not washed off as directed.  4. Your progress will be rechecked in 1-2 months; call sooner if there are any questions or problems.

## 2020-04-21 ENCOUNTER — Other Ambulatory Visit: Payer: Self-pay

## 2020-04-21 ENCOUNTER — Ambulatory Visit: Payer: BC Managed Care – PPO | Admitting: Dermatology

## 2020-04-21 DIAGNOSIS — B079 Viral wart, unspecified: Secondary | ICD-10-CM | POA: Diagnosis not present

## 2020-04-21 DIAGNOSIS — B078 Other viral warts: Secondary | ICD-10-CM

## 2020-04-21 MED ORDER — IMIQUIMOD 5 % EX CREA: TOPICAL_CREAM | CUTANEOUS | 0 refills | Status: DC

## 2020-04-21 NOTE — Progress Notes (Signed)
   Follow-Up Visit   Subjective  Stacy Montes is a 24 y.o. female who presents for the following: Warts (Patient here today for 1 month wart follow up at right pretibia and B/L hand, treated with cantharone in office and imiquimod at home. ).  Patient advises they have improved.   The following portions of the chart were reviewed this encounter and updated as appropriate:   Tobacco  Allergies  Meds  Problems  Med Hx  Surg Hx  Fam Hx      Review of Systems:  No other skin or systemic complaints except as noted in HPI or Assessment and Plan.  Objective  Well appearing patient in no apparent distress; mood and affect are within normal limits.  A focused examination was performed including legs, hands. Relevant physical exam findings are noted in the Assessment and Plan.  Objective  right leg x 5, left leg x 1, right dorsal hand x 1, left dorsal hand x 1 (8): Verrucous papules   Assessment & Plan  Viral warts, unspecified type (8) right leg x 5, left leg x 1, right dorsal hand x 1, left dorsal hand x 1  Continue imiquimod to affected areas nightly.   Cantharidin is a blistering agent that comes from a beetle.  It needs to be washed off in about 4 hours after application.  Although it is painless when applied in office, it may cause symptoms of mild pain and burning several hours later.  Treated areas will swell and turn red, and blisters may form.  Vaseline and a bandaid may be applied until wound has healed.  Once healed, the skin may remain temporarily discolored.  It can take weeks to months for pigmentation to return to normal.    Discussed viral etiology and risk of spread.  Discussed multiple treatments may be required to clear warts.  Discussed possible post-treatment dyspigmentation and risk of recurrence.   Destruction of lesion - right leg x 5, left leg x 1, right dorsal hand x 1, left dorsal hand x 1 Complexity: simple   Destruction method: chemical removal    Informed consent: discussed and consent obtained   Timeout:  patient name, date of birth, surgical site, and procedure verified Chemical destruction method: cantharidin   Procedure instructions: patient instructed to wash and dry area   Outcome: patient tolerated procedure well with no complications   Post-procedure details: wound care instructions given    Other viral warts  Reordered Medications imiquimod (ALDARA) 5 % cream  Return in about 5 weeks (around 05/26/2020) for wart f/u.  Anise Salvo, RMA, am acting as scribe for Darden Dates, MD .  Documentation: I have reviewed the above documentation for accuracy and completeness, and I agree with the above.  Darden Dates, MD

## 2020-04-21 NOTE — Patient Instructions (Addendum)
Cantharidin is a blistering agent that comes from a beetle.  It needs to be washed off in about 4 hours after application.  Although it is painless when applied in office, it may cause symptoms of mild pain and burning several hours later.  Treated areas will swell and turn red, and blisters may form.  Vaseline and a bandaid may be applied until wound has healed.  Once healed, the skin may remain temporarily discolored.  It can take weeks to months for pigmentation to return to normal.    Instructions for After In-Office Application of Cantharidin  1. This is a strong medicine; please follow ALL instructions.  2. Gently wash off with soap and water in four hours or sooner s directed by your physician.  3. **WARNING** this medicine can cause severe blistering, blood blisters, infection, and/or scarring if it is not washed off as directed.  4. Your progress will be rechecked in 1-2 months; call sooner if there are any questions or problems.  

## 2020-04-27 ENCOUNTER — Encounter: Payer: Self-pay | Admitting: Otolaryngology

## 2020-04-27 ENCOUNTER — Other Ambulatory Visit: Payer: Self-pay

## 2020-05-02 ENCOUNTER — Other Ambulatory Visit: Payer: Self-pay

## 2020-05-02 ENCOUNTER — Other Ambulatory Visit
Admission: RE | Admit: 2020-05-02 | Discharge: 2020-05-02 | Disposition: A | Discharge status: Home / Self Care | Payer: BC Managed Care – PPO | Source: Ambulatory Visit | Attending: Otolaryngology | Admitting: Otolaryngology

## 2020-05-02 DIAGNOSIS — Z20822 Contact with and (suspected) exposure to covid-19: Secondary | ICD-10-CM | POA: Insufficient documentation

## 2020-05-02 DIAGNOSIS — Z01812 Encounter for preprocedural laboratory examination: Secondary | ICD-10-CM | POA: Insufficient documentation

## 2020-05-02 DIAGNOSIS — J3501 Chronic tonsillitis: Secondary | ICD-10-CM | POA: Diagnosis not present

## 2020-05-02 DIAGNOSIS — R599 Enlarged lymph nodes, unspecified: Secondary | ICD-10-CM | POA: Diagnosis not present

## 2020-05-03 LAB — SARS CORONAVIRUS 2 (TAT 6-24 HRS): SARS Coronavirus 2: NEGATIVE

## 2020-05-04 ENCOUNTER — Other Ambulatory Visit: Payer: Self-pay

## 2020-05-04 ENCOUNTER — Ambulatory Visit
Admission: RE | Admit: 2020-05-04 | Discharge: 2020-05-04 | Disposition: A | Discharge status: Home / Self Care | Payer: BC Managed Care – PPO | Source: Ambulatory Visit | Attending: Otolaryngology | Admitting: Otolaryngology

## 2020-05-04 ENCOUNTER — Encounter
Admission: RE | Disposition: A | Discharge status: Home / Self Care | Payer: Self-pay | Source: Ambulatory Visit | Attending: Otolaryngology

## 2020-05-04 ENCOUNTER — Ambulatory Visit: Payer: BC Managed Care – PPO | Admitting: Anesthesiology

## 2020-05-04 ENCOUNTER — Encounter: Payer: Self-pay | Admitting: Otolaryngology

## 2020-05-04 DIAGNOSIS — J3501 Chronic tonsillitis: Secondary | ICD-10-CM | POA: Insufficient documentation

## 2020-05-04 DIAGNOSIS — R599 Enlarged lymph nodes, unspecified: Secondary | ICD-10-CM | POA: Insufficient documentation

## 2020-05-04 DIAGNOSIS — Z20822 Contact with and (suspected) exposure to covid-19: Secondary | ICD-10-CM | POA: Insufficient documentation

## 2020-05-04 HISTORY — PX: TONSILLECTOMY AND ADENOIDECTOMY: SHX28

## 2020-05-04 HISTORY — DX: Headache, unspecified: R51.9

## 2020-05-04 LAB — POCT PREGNANCY, URINE: Preg Test, Ur: NEGATIVE

## 2020-05-04 SURGERY — TONSILLECTOMY AND ADENOIDECTOMY
Anesthesia: General | Site: Throat | Laterality: Bilateral

## 2020-05-04 MED ORDER — SCOPOLAMINE 1 MG/3DAYS TD PT72
1.0000 | MEDICATED_PATCH | TRANSDERMAL | Status: DC
  Administered 2020-05-04: 1.5 mg via TRANSDERMAL

## 2020-05-04 MED ORDER — FENTANYL CITRATE (PF) 100 MCG/2ML IJ SOLN
25.0000 ug | INTRAMUSCULAR | Status: DC | PRN
  Administered 2020-05-04 (×2): 25 ug via INTRAVENOUS

## 2020-05-04 MED ORDER — OXYCODONE HCL 5 MG PO TABS: 5.0000 mg | ORAL_TABLET | Freq: Once | ORAL | Status: AC | PRN

## 2020-05-04 MED ORDER — OXYCODONE HCL 5 MG/5ML PO SOLN: 5.0000 mg | Freq: Four times a day (QID) | ORAL | 0 refills | Status: DC | PRN

## 2020-05-04 MED ORDER — DEXAMETHASONE SODIUM PHOSPHATE 4 MG/ML IJ SOLN
INTRAMUSCULAR | Status: DC | PRN
  Administered 2020-05-04: 10 mg via INTRAVENOUS

## 2020-05-04 MED ORDER — OXYMETAZOLINE HCL 0.05 % NA SOLN
NASAL | Status: DC | PRN
  Administered 2020-05-04: 1 via TOPICAL

## 2020-05-04 MED ORDER — ONDANSETRON HCL 4 MG PO TABS: 4.0000 mg | ORAL_TABLET | Freq: Three times a day (TID) | ORAL | 0 refills | Status: DC | PRN

## 2020-05-04 MED ORDER — FENTANYL CITRATE (PF) 100 MCG/2ML IJ SOLN
INTRAMUSCULAR | Status: DC | PRN
  Administered 2020-05-04 (×2): 50 ug via INTRAVENOUS

## 2020-05-04 MED ORDER — SUCCINYLCHOLINE CHLORIDE 20 MG/ML IJ SOLN
INTRAMUSCULAR | Status: DC | PRN
  Administered 2020-05-04: 80 mg via INTRAVENOUS

## 2020-05-04 MED ORDER — DEXMEDETOMIDINE HCL 200 MCG/2ML IV SOLN
INTRAVENOUS | Status: DC | PRN
  Administered 2020-05-04 (×2): 10 ug via INTRAVENOUS

## 2020-05-04 MED ORDER — PROPOFOL 10 MG/ML IV BOLUS
INTRAVENOUS | Status: DC | PRN
  Administered 2020-05-04: 150 mg via INTRAVENOUS

## 2020-05-04 MED ORDER — ACETAMINOPHEN 10 MG/ML IV SOLN
1000.0000 mg | Freq: Once | INTRAVENOUS | Status: AC
  Administered 2020-05-04: 1000 mg via INTRAVENOUS

## 2020-05-04 MED ORDER — LIDOCAINE HCL (CARDIAC) PF 100 MG/5ML IV SOSY
PREFILLED_SYRINGE | INTRAVENOUS | Status: DC | PRN
  Administered 2020-05-04: 50 mg via INTRAVENOUS

## 2020-05-04 MED ORDER — LACTATED RINGERS IV SOLN: INTRAVENOUS | Status: DC

## 2020-05-04 MED ORDER — BUPIVACAINE HCL (PF) 0.25 % IJ SOLN
INTRAMUSCULAR | Status: DC | PRN
  Administered 2020-05-04: 2 mL

## 2020-05-04 MED ORDER — LIDOCAINE VISCOUS HCL 2 % MT SOLN: 10.0000 mL | Freq: Four times a day (QID) | OROMUCOSAL | 0 refills | Status: DC | PRN

## 2020-05-04 MED ORDER — OXYCODONE HCL 5 MG/5ML PO SOLN
5.0000 mg | Freq: Once | ORAL | Status: AC | PRN
  Administered 2020-05-04: 5 mg via ORAL

## 2020-05-04 MED ORDER — GLYCOPYRROLATE 0.2 MG/ML IJ SOLN
INTRAMUSCULAR | Status: DC | PRN
  Administered 2020-05-04: .1 mg via INTRAVENOUS

## 2020-05-04 MED ORDER — ONDANSETRON HCL 4 MG/2ML IJ SOLN
INTRAMUSCULAR | Status: DC | PRN
  Administered 2020-05-04: 4 mg via INTRAVENOUS

## 2020-05-04 MED ORDER — MIDAZOLAM HCL 5 MG/5ML IJ SOLN
INTRAMUSCULAR | Status: DC | PRN
  Administered 2020-05-04: 2 mg via INTRAVENOUS

## 2020-05-04 SURGICAL SUPPLY — 15 items
BLADE BOVIE TIP EXT 4 (BLADE) ×2 IMPLANT
CANISTER SUCT 1200ML W/VALVE (MISCELLANEOUS) ×2 IMPLANT
CATH ROBINSON RED A/P 10FR (CATHETERS) ×2 IMPLANT
COAG SUCT 10F 3.5MM HAND CTRL (MISCELLANEOUS) ×2 IMPLANT
ELECT REM PT RETURN 9FT ADLT (ELECTROSURGICAL) ×2
ELECTRODE REM PT RTRN 9FT ADLT (ELECTROSURGICAL) ×1 IMPLANT
GLOVE BIO SURGEON STRL SZ7.5 (GLOVE) ×3 IMPLANT
KIT TURNOVER KIT A (KITS) ×2 IMPLANT
NS IRRIG 500ML POUR BTL (IV SOLUTION) ×2 IMPLANT
PACK TONSIL AND ADENOID CUSTOM (PACKS) ×2 IMPLANT
PENCIL SMOKE EVACUATOR (MISCELLANEOUS) ×2 IMPLANT
SLEEVE SUCTION 125 (MISCELLANEOUS) ×2 IMPLANT
SOL ANTI-FOG 6CC FOG-OUT (MISCELLANEOUS) ×1 IMPLANT
SOL FOG-OUT ANTI-FOG 6CC (MISCELLANEOUS) ×1
STRAP BODY AND KNEE 60X3 (MISCELLANEOUS) ×2 IMPLANT

## 2020-05-04 NOTE — Anesthesia Preprocedure Evaluation (Signed)
Anesthesia Evaluation  Patient identified by MRN, date of birth, ID band Patient awake    History of Anesthesia Complications Negative for: history of anesthetic complications  Airway Mallampati: II  TM Distance: >3 FB Neck ROM: Full    Dental no notable dental hx.    Pulmonary neg pulmonary ROS,    Pulmonary exam normal        Cardiovascular Exercise Tolerance: Good negative cardio ROS Normal cardiovascular exam     Neuro/Psych negative neurological ROS     GI/Hepatic negative GI ROS, Neg liver ROS,   Endo/Other  negative endocrine ROS  Renal/GU negative Renal ROS     Musculoskeletal   Abdominal   Peds  Hematology negative hematology ROS (+)   Anesthesia Other Findings   Reproductive/Obstetrics                             Anesthesia Physical Anesthesia Plan  ASA: I  Anesthesia Plan: General   Post-op Pain Management:    Induction:   PONV Risk Score and Plan: 3 and Scopolamine patch - Pre-op, Midazolam, Ondansetron, Dexamethasone and Treatment may vary due to age or medical condition  Airway Management Planned: Oral ETT  Additional Equipment: None  Intra-op Plan:   Post-operative Plan:   Informed Consent: I have reviewed the patients History and Physical, chart, labs and discussed the procedure including the risks, benefits and alternatives for the proposed anesthesia with the patient or authorized representative who has indicated his/her understanding and acceptance.       Plan Discussed with: CRNA  Anesthesia Plan Comments:         Anesthesia Quick Evaluation

## 2020-05-04 NOTE — Anesthesia Procedure Notes (Signed)
Procedure Name: Intubation Date/Time: 05/04/2020 9:59 AM Performed by: Jimmy Picket, CRNA Pre-anesthesia Checklist: Patient identified, Emergency Drugs available, Suction available, Patient being monitored and Timeout performed Patient Re-evaluated:Patient Re-evaluated prior to induction Oxygen Delivery Method: Circle system utilized Preoxygenation: Pre-oxygenation with 100% oxygen Induction Type: IV induction Ventilation: Mask ventilation without difficulty Laryngoscope Size: Miller and 2 Grade View: Grade I Tube type: Oral Rae Tube size: 7.0 mm Number of attempts: 1 Placement Confirmation: ETT inserted through vocal cords under direct vision,  positive ETCO2 and breath sounds checked- equal and bilateral Tube secured with: Tape Dental Injury: Teeth and Oropharynx as per pre-operative assessment

## 2020-05-04 NOTE — Anesthesia Postprocedure Evaluation (Signed)
Anesthesia Post Note  Patient: Stacy Montes  Procedure(s) Performed: TONSILLECTOMY AND ADENOIDECTOMY (Bilateral Throat)     Patient location during evaluation: PACU Anesthesia Type: General Level of consciousness: awake and alert Pain management: pain level controlled Vital Signs Assessment: post-procedure vital signs reviewed and stable Respiratory status: spontaneous breathing, nonlabored ventilation, respiratory function stable and patient connected to nasal cannula oxygen Cardiovascular status: blood pressure returned to baseline and stable Postop Assessment: no apparent nausea or vomiting Anesthetic complications: no   No complications documented.  Wille Celeste Faron Whitelock

## 2020-05-04 NOTE — Op Note (Signed)
..  05/04/2020  10:26 AM    [place], Rashi  [id number]   Pre-Op Dx:  chronic tonsillitis  Post-op Dx: chronic tonsillitis  Proc:Tonsillectomy and Adenoidectomy > age 24  Surg: Rosealee Recinos  Anes:  General Endotracheal  EBL:  <28ml  Comp:  None  Findings:  3+ tonsils, 2+ partially obstructive adenoids that were ablated so no adenoid specimen obtained  Procedure: After the patient was identified in holding and the history and physical and consent was reviewed, the patient was taken to the operating room and placed in a supine position.  General endotracheal anesthesia was induced in the normal fashion.  At this time, the patient was rotated 45 degrees and a shoulder roll was placed.  At this time, a McIvor mouthgag was inserted into the patient's oral cavity and suspended from the Mayo stand without injury to teeth, lips, or gums.  Next a red rubber catheter was inserted into the patient left nostril for retraction of the uvula and soft palate superiorly.  Next a curved Alice clamp was attached to the patient's right superior tonsillar pole and retracted medially and inferiorly.  A Bovie electrocautery was used to dissect the patient's right tonsil in a subcapsular plane.  Meticulous hemostasis was achieved with Bovie suction cautery.  At this time, the mouth gag was released from suspension for 1 minute.  Attention now was directed to the patient's left side.  In a similar fashion the curved Alice clamp was attached to the superior pole and this was retracted medially and inferiorly and the tonsil was excised in a subcapsular plane with Bovie electrocautery.  After completion of the second tonsil, meticulous hemostasis was continued.  At this time, attention was directed to the patient's Adenoidectomy.  Under indirect visualization using an operating mirror, the adenoid tissue was visualized and noted to be obstructive in nature.  Using a Bovie suction cautery, the adenoid tissue was  ablated and dessicated.  Meticulous hemostasis was continued.  At this time, the patient's nasal cavity and oral cavity was irrigated with sterile saline.  2 ml of 0.25% Marcaine was injected into the anterior and posterior tonsillar fossa bilaterally.  Following this  The care of patient was returned to anesthesia, awakened, and transferred to recovery in stable condition.  Dispo:  PACU to home  Plan: Soft diet.  Limit exercise and strenuous activity for 2 weeks.  Fluid hydration  Recheck my office three weeks.   Aarian Griffie 10:26 AM 05/04/2020

## 2020-05-04 NOTE — Discharge Instructions (Signed)
T & A INSTRUCTION SHEET - [facility] °[facility] ° °CREIGHTON VAUGHT, MD ° °[address] [place], [place] [address] [place].  °[phone] ° °INFORMATION SHEET FOR A TONSILLECTOMY AND ADENDOIDECTOMY ° °About Your Tonsils and Adenoids ° The tonsils and adenoids are normal body tissues that are part of our immune system.  They normally help to protect us against diseases that may enter our mouth and nose. However, sometimes the tonsils and/or adenoids become too large and obstruct our breathing, especially at night. °  ° If either of these things happen it helps to remove the tonsils and adenoids in order to become healthier. The operation to remove the tonsils and adenoids is called a tonsillectomy and adenoidectomy. ° °The Location of Your Tonsils and Adenoids ° The tonsils are located in the back of the throat on both side and sit in a cradle of muscles. The adenoids are located in the roof of the mouth, behind the nose, and closely associated with the opening of the Eustachian tube to the ear. ° °Surgery on Tonsils and Adenoids ° A tonsillectomy and adenoidectomy is a short operation which takes about thirty minutes.  This includes being put to sleep and being awakened. Tonsillectomies and adenoidectomies are performed at [facility] and may require observation period in the recovery room prior to going home. Children are required to remain in recovery for at least 45 minutes.  ° °Following the Operation for a Tonsillectomy ° A cautery machine is used to control bleeding. Bleeding from a tonsillectomy and adenoidectomy is minimal and postoperatively the risk of bleeding is approximately four percent, although this rarely life threatening. ° °After your tonsillectomy and adenoidectomy post-op care at home: °1. Our patients are able to go home the same day. You may be given prescriptions for pain medications, if indicated. °2. It is extremely important to  remember that fluid intake is of utmost importance after a tonsillectomy. The amount that you drink must be maintained in the postoperative period. A good indication of whether a child is getting enough fluid is whether his/her urine output is constant. As long as children are urinating or wetting their diaper every 6 - 8 hours this is usually enough fluid intake.   °3. Although rare, this is a risk of some bleeding in the first ten days after surgery. This usually occurs between day five and nine postoperatively. This risk of bleeding is approximately four percent. If you or your child should have any bleeding you should remain calm and notify our office or go directly to the emergency room at [facility] where they will contact us. Our doctors are available seven days a week for notification. We recommend sitting up quietly in a chair, place an ice pack on the front of the neck and spitting out the blood gently until we are able to contact you. Adults should gargle gently with ice water and this may help stop the bleeding. If the bleeding does not stop after a short time, i.e. 10 to 15 minutes, or seems to be increasing again, please contact us or go to the hospital.   °4. It is common for the pain to be worse at 5 - 7 days postoperatively. This occurs because the “scab” is peeling off and the mucous membrane (skin of the throat) is growing back where the tonsils were.   °5. It is common for a low-grade fever, less than 102, during the first week   after a tonsillectomy and adenoidectomy. It is usually due to not drinking enough liquids, and we suggest your use liquid Tylenol (acetaminophen) or the pain medicine with Tylenol (acetaminophen) prescribed in order to keep your temperature below 102. Please follow the directions on the back of the bottle. °6. Recommendations for post-operative pain in children and adults: °a) For Children 12 and younger: Recommendations are for oral Tylenol  (acetaminophen) and oral Motrin (Ibuprofen) along with a prescription dose of Prednisolone which is a steroid to help with pain and swelling. Administer the Tylenol (acetaminophen) and Motrin as stated on bottle for patient's age/weight. Sometimes it may be necessary to alternate the Tylenol (acetaminophen) and Motrin for improved pain control. Motrin does last slightly longer so many patients benefit from being given this prior to bedtime. All children should avoid Aspirin products for 2 weeks following surgery. °b) For children over the age of 12: Tylenol (acetaminophen) is the preferred first choice for pain control. Depending on your child's size, sometimes they will be given a combination of Tylenol (acetaminophen) and hydrocodone medication or sometimes it will be recommended they take Motrin (ibuprofen) in addition to the Tylenol (acetaminophen). Narcotics should always be used with caution in children following surgery as they can suppress their breathing and switching to over the counter Tylenol (acetaminophen) and Motrin (ibuprofen) as soon as possible is recommended. All patients should avoid Aspirin products for 2 weeks following surgery. °c) Adults: Usually adults will require a narcotic pain medication following a tonsillectomy. This usually has either hydrocodone or oxycodone in it and can usually be taken every 4 to 6 hours as needed for moderate pain. If the medication does not have Tylenol (acetaminophen) in it, you may also supplement Tylenol (acetaminophen) as needed every 4 to 6 hours for breakthrough or mild pain. Adults are also given Viscous Lidocaine to swish and spit every 6 hours to help with topical pain. Adults should avoid Aspirin, Aleve, Motrin, and Ibuprofen products for 2 weeks following surgery as they can increase your risk of bleeding. °7. If you happen to look in the mirror or into your child's mouth you will see white/gray patches on the back of the throat. This is what a scab  looks like in the mouth and is normal after having a tonsillectomy and adenoidectomy. They will disappear once the tonsil areas heal completely. However, it may cause a noticeable odor, and this too will disappear with time.     °8. You or your child may experience ear pain after having a tonsillectomy and adenoidectomy.  This is called referred pain and comes from the throat, but it is felt in the ears.  Ear pain is quite common and expected. It will usually go away after ten days. There is usually nothing wrong with the ears, and it is primarily due to the healing area stimulating the nerve to the ear that runs along the side of the throat. Use either the prescribed pain medicine or Tylenol (acetaminophen) as needed.  °9. The throat tissues after a tonsillectomy are obviously sensitive. Smoking around children who have had a tonsillectomy significantly increases the risk of bleeding. DO NOT SMOKE! ° °What to Expect Each Day  °First Day at Home °1. Patients will be discharged home the same day.  °2. Drink at least four glasses of liquid a day. Clear, cool liquids are recommended. Fruit juices containing citric acid are not recommended because they tend to cause pain. Carbonated beverages are allowed if you pour them from glass   to glass to remove the bubbles as these tend to cause discomfort. Avoid alcoholic beverages.  °3. Eat very soft foods such as soups, broth, jello, custard, pudding, ice cream, popsicles, applesauce, mashed potatoes, and in general anything that you can crush between your tongue and the roof of your mouth. Try adding Carnation Instant Breakfast Mix into your food for extra calories. It is not uncommon to lose 5 to 10 pounds of fluid weight. The weight will be gained back quickly once you're feeling better and drinking more.  °4. Sleep with your head elevated on two pillows for about three days to help decrease the swelling.  °5. DO NOT SMOKE!  °Day Two  °1. Rest as much as possible. Use common  sense in your activities.  °2. Continue drinking at least four glasses of liquid per day.  °3. Follow the soft diet.  °4. Use your pain medication as needed.  °Day Three  °1. Advance your activity as you are able and continue to follow the previous day's suggestions.  °Days Four Through Six  °1. Advance your diet and begin to eat more solid foods such as chopped hamburger. °2. Advance your activities slowly. Children should be kept mostly around the house.  °3. Not uncommonly, there will be more pain at this time. It is temporary, usually lasting a day or two.  °Day Seven Through Ten  °1. Most individuals by this time are able to return to work or school unless otherwise instructed. Consider sending children back to school for a half day on the first day back. ° °Scopolamine skin patches °REMOVE PATCH IN 72 HOURS AND WASH HANDS IMMEDIATELY ° °What is this medicine? °SCOPOLAMINE (skoe POL a meen) is used to prevent nausea and vomiting caused by motion sickness, anesthesia and surgery. °This medicine may be used for other purposes; ask your health care provider or pharmacist if you have questions. °COMMON BRAND NAME(S): Transderm Scop °What should I tell my health care provider before I take this medicine? °They need to know if you have any of these conditions: °· are scheduled to have a gastric secretion test °· glaucoma °· heart disease °· kidney disease °· liver disease °· lung or breathing disease, like asthma °· mental illness °· prostate disease °· seizures °· stomach or intestine problems °· trouble passing urine °· an unusual or allergic reaction to scopolamine, atropine, other medicines, foods, dyes, or preservatives °· pregnant or trying to get pregnant °· breast-feeding °How should I use this medicine? °This medicine is for external use only. Follow the directions on the prescription label. Wear only 1 patch at a time. Choose an area behind the ear, that is clean, dry, hairless and free from any cuts or  irritation. Wipe the area with a clean dry tissue. Peel off the plastic backing of the skin patch, trying not to touch the adhesive side with your hands. Do not cut the patches. Firmly apply to the area you have chosen, with the metallic side of the patch to the skin and the tan-colored side showing. Once firmly in place, wash your hands well with soap and water. Do not get this medicine into your eyes. °After removing the patch, wash your hands and the area behind your ear thoroughly with soap and water. The patch will still contain some medicine after use. To avoid accidental contact or ingestion by children or pets, fold the used patch in half with the sticky side together and throw away in the trash out of   the reach of children and pets. If you need to use a second patch after you remove the first, place it behind the other ear. °A special MedGuide will be given to you by the pharmacist with each prescription and refill. Be sure to read this information carefully each time. °Talk to your pediatrician regarding the use of this medicine in children. Special care may be needed. °Overdosage: If you think you have taken too much of this medicine contact a poison control center or emergency room at once. °NOTE: This medicine is only for you. Do not share this medicine with others. °What if I miss a dose? °This does not apply. This medicine is not for regular use. °What may interact with this medicine? °· alcohol °· antihistamines for allergy cough and cold °· atropine °· certain medicines for anxiety or sleep °· certain medicines for bladder problems like oxybutynin, tolterodine °· certain medicines for depression like amitriptyline, fluoxetine, sertraline °· certain medicines for stomach problems like dicyclomine, hyoscyamine °· certain medicines for Parkinson's disease like benztropine, trihexyphenidyl °· certain medicines for seizures like phenobarbital, primidone °· general anesthetics like halothane, isoflurane,  methoxyflurane, propofol °· ipratropium °· local anesthetics like lidocaine, pramoxine, tetracaine °· medicines that relax muscles for surgery °· phenothiazines like chlorpromazine, mesoridazine, prochlorperazine, thioridazine °· narcotic medicines for pain °· other belladonna alkaloids °This list may not describe all possible interactions. Give your health care provider a list of all the medicines, herbs, non-prescription drugs, or dietary supplements you use. Also tell them if you smoke, drink alcohol, or use illegal drugs. Some items may interact with your medicine. °What should I watch for while using this medicine? °Limit contact with water while swimming and bathing because the patch may fall off. If the patch falls off, throw it away and put a new one behind the other ear. °You may get drowsy or dizzy. Do not drive, use machinery, or do anything that needs mental alertness until you know how this medicine affects you. Do not stand or sit up quickly, especially if you are an older patient. This reduces the risk of dizzy or fainting spells. Alcohol may interfere with the effect of this medicine. Avoid alcoholic drinks. °Your mouth may get dry. Chewing sugarless gum or sucking hard candy, and drinking plenty of water may help. Contact your healthcare professional if the problem does not go away or is severe. °This medicine may cause dry eyes and blurred vision. If you wear contact lenses, you may feel some discomfort. Lubricating drops may help. See your healthcare professional if the problem does not go away or is severe. °If you are going to need surgery, an MRI, CT scan, or other procedure, tell your healthcare professional that you are using this medicine. You may need to remove the patch before the procedure. °What side effects may I notice from receiving this medicine? °Side effects that you should report to your doctor or health care professional as soon as possible: °· allergic reactions like skin rash,  itching or hives; swelling of the face, lips, or tongue °· blurred vision °· changes in vision °· confusion °· dizziness °· eye pain °· fast, irregular heartbeat °· hallucinations, loss of contact with reality °· nausea, vomiting °· pain or trouble passing urine °· restlessness °· seizures °· skin irritation °· stomach pain °Side effects that usually do not require medical attention (report to your doctor or health care professional if they continue or are bothersome): °· drowsiness °· dry mouth °· headache °· sore   throat °This list may not describe all possible side effects. Call your doctor for medical advice about side effects. You may report side effects to FDA at 1-800-FDA-1088. °Where should I keep my medicine? °Keep out of the reach of children. °Store at room temperature between 20 and 25 degrees C (68 and 77 degrees F). Keep this medicine in the foil package until ready to use. Throw away any unused medicine after the expiration date. °NOTE: This sheet is a summary. It may not cover all possible information. If you have questions about this medicine, talk to your doctor, pharmacist, or health care provider. °© 2020 Elsevier/Gold Standard (2017-07-26 16:14:46) ° ° °General Anesthesia, Adult, Care After °This sheet gives you information about how to care for yourself after your procedure. Your health care provider may also give you more specific instructions. If you have problems or questions, contact your health care provider. °What can I expect after the procedure? °After the procedure, the following side effects are common: °· Pain or discomfort at the IV site. °· Nausea. °· Vomiting. °· Sore throat. °· Trouble concentrating. °· Feeling cold or chills. °· Weak or tired. °· Sleepiness and fatigue. °· Soreness and body aches. These side effects can affect parts of the body that were not involved in surgery. °Follow these instructions at home: ° °For at least 24 hours after the procedure: °· Have a  responsible adult stay with you. It is important to have someone help care for you until you are awake and alert. °· Rest as needed. °· Do not: °? Participate in activities in which you could fall or become injured. °? Drive. °? Use heavy machinery. °? Drink alcohol. °? Take sleeping pills or medicines that cause drowsiness. °? Make important decisions or sign legal documents. °? Take care of children on your own. °Eating and drinking °· Follow any instructions from your health care provider about eating or drinking restrictions. °· When you feel hungry, start by eating small amounts of foods that are soft and easy to digest (bland), such as toast. Gradually return to your regular diet. °· Drink enough fluid to keep your urine pale yellow. °· If you vomit, rehydrate by drinking water, juice, or clear broth. °General instructions °· If you have sleep apnea, surgery and certain medicines can increase your risk for breathing problems. Follow instructions from your health care provider about wearing your sleep device: °? Anytime you are sleeping, including during daytime naps. °? While taking prescription pain medicines, sleeping medicines, or medicines that make you drowsy. °· Return to your normal activities as told by your health care provider. Ask your health care provider what activities are safe for you. °· Take over-the-counter and prescription medicines only as told by your health care provider. °· If you smoke, do not smoke without supervision. °· Keep all follow-up visits as told by your health care provider. This is important. °Contact a health care provider if: °· You have nausea or vomiting that does not get better with medicine. °· You cannot eat or drink without vomiting. °· You have pain that does not get better with medicine. °· You are unable to pass urine. °· You develop a skin rash. °· You have a fever. °· You have redness around your IV site that gets worse. °Get help right away if: °· You have  difficulty breathing. °· You have chest pain. °· You have blood in your urine or stool, or you vomit blood. °Summary °· After the procedure, it is   common to have a sore throat or nausea. It is also common to feel tired. °· Have a responsible adult stay with you for the first 24 hours after general anesthesia. It is important to have someone help care for you until you are awake and alert. °· When you feel hungry, start by eating small amounts of foods that are soft and easy to digest (bland), such as toast. Gradually return to your regular diet. °· Drink enough fluid to keep your urine pale yellow. °· Return to your normal activities as told by your health care provider. Ask your health care provider what activities are safe for you. °This information is not intended to replace advice given to you by your health care provider. Make sure you discuss any questions you have with your health care provider. °Document Revised: 05/10/2017 Document Reviewed: 12/21/2016 °Elsevier Patient Education © 2020 [employer]. ° °

## 2020-05-04 NOTE — H&P (Signed)
..  History and Physical paper copy reviewed and updated date of procedure and will be scanned into system.  Patient seen and examined.

## 2020-05-04 NOTE — Transfer of Care (Signed)
Immediate Anesthesia Transfer of Care Note  Patient: Stacy Montes  Procedure(s) Performed: TONSILLECTOMY AND ADENOIDECTOMY (Bilateral Throat)  Patient Location: PACU  Anesthesia Type: General  Level of Consciousness: awake, alert  and patient cooperative  Airway and Oxygen Therapy: Patient Spontanous Breathing and Patient connected to supplemental oxygen  Post-op Assessment: Post-op Vital signs reviewed, Patient's Cardiovascular Status Stable, Respiratory Function Stable, Patent Airway and No signs of Nausea or vomiting  Post-op Vital Signs: Reviewed and stable  Complications: No complications documented.

## 2020-05-05 ENCOUNTER — Encounter: Payer: Self-pay | Admitting: Otolaryngology

## 2020-05-06 LAB — SURGICAL PATHOLOGY

## 2020-05-18 ENCOUNTER — Encounter: Payer: Self-pay | Admitting: Dermatology

## 2020-05-26 DIAGNOSIS — R87612 Low grade squamous intraepithelial lesion on cytologic smear of cervix (LGSIL): Secondary | ICD-10-CM | POA: Insufficient documentation

## 2020-05-26 NOTE — Progress Notes (Signed)
PCP:  [facility]   Chief Complaint  Patient presents with  . Gynecologic Exam    Annual - discuss IUD options. RM 14     HPI:      [name] is a 25 y.o. G0P0000 who LMP was Patient's last menstrual period was 05/26/2020., presents today for her annual examination.  Her menses are monthly, lasting 7 days, mod flow, no BTB, mild dysmen.    Sex activity: currently sexually active--contraception condoms. Stopped OCPs about 7 months ago due to decreased libido/not sex active. Would like IUD .  Last Pap: 04/22/19 Results were LGSIL; repeat pap due today due to age Hx of STDs: none  There is no FH of breast cancer. There is no FH of ovarian cancer. The patient does self-breast exams.  Tobacco use: The patient denies current or previous tobacco use. Alcohol use: none No drug use.  Exercise: moderately active  She does get adequate calcium and Vitamin D in her diet. Thinks she had Gardasil vaccine.   Patient Active Problem List   Diagnosis Date Noted  . 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    Past Surgical History:  Procedure Laterality Date  . TONSILLECTOMY AND ADENOIDECTOMY Bilateral 05/04/2020   Procedure: TONSILLECTOMY AND ADENOIDECTOMY;  Surgeon: Bud Face, MD;  Location: [facility];  Service: ENT;  Laterality: Bilateral;  NO ADENOIDS SENT FOR SPECIMEN ONLY CAUTERIZED  . WISDOM TOOTH EXTRACTION  05/2013    Family History  Problem Relation Age of Onset  . Diabetes Maternal Grandmother        TYPE 2    Social History   Socioeconomic History  . Marital status: Single    Spouse name: Not on file  . Number of children: 0  . Years of education: Not on file  . Highest education level: Not on file  Occupational History  . Not on file  Tobacco Use  . Smoking status: Never Smoker  . Smokeless tobacco: Never Used  Vaping Use  . Vaping Use: Never used  Substance and Sexual Activity  . Alcohol use: Not Currently    Comment:  rare  . Drug use: No  . Sexual activity: Not Currently    Birth control/protection: Pill  Other Topics Concern  . Not on file  Social History Narrative  . Not on file   Social Determinants of Health   Financial Resource Strain: Not on file  Food Insecurity: Not on file  Transportation Needs: Not on file  Physical Activity: Not on file  Stress: Not on file  Social Connections: Not on file  Intimate Partner Violence: Not on file     Current Outpatient Medications:  .  doxycycline (PERIOSTAT) 20 MG tablet, Take 20 mg by mouth 2 (two) times daily., Disp: , Rfl:  .  imiquimod (ALDARA) 5 % cream, Apply topically 3 (three) times a week., Disp: 12 each, Rfl: 0 .  misoprostol (CYTOTEC) 100 MCG tablet, Take 1 tablet (100 mcg total) by mouth once for 1 dose. 1 hour before appt, Disp: 1 tablet, Rfl: 0 .  lidocaine (XYLOCAINE) 2 % solution, Use as directed 10 mLs in the mouth or throat every 6 (six) hours as needed for mouth pain (Swish and spit). (Patient not taking: Reported on 05/27/2020), Disp: 250 mL, Rfl: 0 .  mupirocin ointment (BACTROBAN) 2 %, APPLY A SMALL AMOUNT TO AFFECTED AREA ONCE A DAY WITH DRESSING CHANGES (Patient not taking: Reported on 05/27/2020), Disp: , Rfl:  .  ondansetron (ZOFRAN) 4 MG tablet, Take 1 tablet (4 mg total) by mouth every 8 (eight) hours as needed for nausea or vomiting. (Patient not taking: Reported on 05/27/2020), Disp: 20 tablet, Rfl: 0 .  oxyCODONE (ROXICODONE) 5 MG/5ML solution, Take 5 mLs (5 mg total) by mouth every 6 (six) hours as needed for severe pain. (Patient not taking: Reported on 05/27/2020), Disp: 280 mL, Rfl: 0     ROS:  Review of Systems  Constitutional: Negative for fatigue, fever and unexpected weight change.  Respiratory: Negative for cough, shortness of breath and wheezing.   Cardiovascular: Negative for chest pain, palpitations and leg swelling.  Gastrointestinal: Negative for blood in stool, constipation, diarrhea, nausea and vomiting.   Endocrine: Negative for cold intolerance, heat intolerance and polyuria.  Genitourinary: Negative for dyspareunia, dysuria, flank pain, frequency, genital sores, hematuria, menstrual problem, pelvic pain, urgency, vaginal bleeding, vaginal discharge and vaginal pain.  Musculoskeletal: Negative for back pain, joint swelling and myalgias.  Skin: Negative for rash.  Neurological: Negative for dizziness, syncope, light-headedness, numbness and headaches.  Hematological: Negative for adenopathy.  Psychiatric/Behavioral: Negative for agitation, confusion, sleep disturbance and suicidal ideas. The patient is not nervous/anxious.    BREAST: No symptoms   Objective: BP 102/66   Ht 5\' 7"  (1.702 m)   Wt 115 lb 6 oz (52.3 kg)   LMP 05/26/2020   BMI 18.07 kg/m    Physical Exam Constitutional:      Appearance: She is well-developed.  Genitourinary:     Vulva normal.     Right Labia: No rash, tenderness or lesions.    Left Labia: No tenderness, lesions or rash.    No vaginal discharge, erythema or tenderness.      Right Adnexa: not tender and no mass present.    Left Adnexa: not tender and no mass present.    No cervical friability or polyp.     Uterus is not enlarged or tender.  Breasts:     Right: No mass, nipple discharge, skin change or tenderness.     Left: No mass, nipple discharge, skin change or tenderness.    Neck:     Thyroid: No thyromegaly.  Cardiovascular:     Rate and Rhythm: Normal rate and regular rhythm.     Heart sounds: Normal heart sounds. No murmur heard.   Pulmonary:     Effort: Pulmonary effort is normal.     Breath sounds: Normal breath sounds.  Abdominal:     Palpations: Abdomen is soft.     Tenderness: There is no abdominal tenderness. There is no guarding or rebound.  Musculoskeletal:        General: Normal range of motion.     Cervical back: Normal range of motion.  Lymphadenopathy:     Cervical: No cervical adenopathy.  Neurological:      General: No focal deficit present.     Mental Status: She is alert and oriented to person, place, and time.     Cranial Nerves: No cranial nerve deficit.  Skin:    General: Skin is warm and dry.  Psychiatric:        Mood and Affect: Mood normal.        Behavior: Behavior normal.        Thought Content: Thought content normal.        Judgment: Judgment normal.  Vitals reviewed.     Assessment/Plan: Encounter for annual routine gynecological examination  Cervical cancer screening - Plan: Cytology - PAP  Screening for  STD (sexually transmitted disease) - Plan: Cytology - PAP  LGSIL on Pap smear of cervix - Plan: Cytology - PAP; repeat pap today, will f/u with results.   Encounter for initial prescription of intrauterine contraceptive device (IUD) - Plan: misoprostol (CYTOTEC) 100 MCG tablet; Kyleena IUD pros/cons discussed. Failed insertion attempt today. Will do Rx cytotec/NSAIDs 1 hr before appt and retry on Mon. Condoms in meantime.    Meds ordered this encounter  Medications  . misoprostol (CYTOTEC) 100 MCG tablet    Sig: Take 1 tablet (100 mcg total) by mouth once for 1 dose. 1 hour before appt    Dispense:  1 tablet    Refill:  0    Order Specific Question:   Supervising Provider    Answer:   Nadara Mustard [[id number]]     GYN counsel adequate intake of calcium and vitamin D, diet and exercise,      F/U  Return in about 3 days (around 05/30/2020) for [facility] insertion with ABC.  Stacy Reinders B. Nnenna Meador, PA-C 05/27/2020 11:50 AM

## 2020-05-27 ENCOUNTER — Telehealth: Payer: Self-pay | Admitting: Obstetrics and Gynecology

## 2020-05-27 ENCOUNTER — Encounter: Payer: Self-pay | Admitting: Obstetrics and Gynecology

## 2020-05-27 ENCOUNTER — Other Ambulatory Visit: Payer: Self-pay

## 2020-05-27 ENCOUNTER — Ambulatory Visit (INDEPENDENT_AMBULATORY_CARE_PROVIDER_SITE_OTHER): Payer: BC Managed Care – PPO | Admitting: Obstetrics and Gynecology

## 2020-05-27 ENCOUNTER — Other Ambulatory Visit (HOSPITAL_COMMUNITY)
Admission: RE | Admit: 2020-05-27 | Discharge: 2020-05-27 | Disposition: A | Discharge status: Home / Self Care | Payer: BC Managed Care – PPO | Source: Ambulatory Visit | Attending: Obstetrics and Gynecology | Admitting: Obstetrics and Gynecology

## 2020-05-27 VITALS — BP 102/66 | Ht 67.0 in | Wt 115.4 lb

## 2020-05-27 DIAGNOSIS — Z01419 Encounter for gynecological examination (general) (routine) without abnormal findings: Secondary | ICD-10-CM

## 2020-05-27 DIAGNOSIS — Z3041 Encounter for surveillance of contraceptive pills: Secondary | ICD-10-CM

## 2020-05-27 DIAGNOSIS — Z113 Encounter for screening for infections with a predominantly sexual mode of transmission: Secondary | ICD-10-CM | POA: Insufficient documentation

## 2020-05-27 DIAGNOSIS — R87612 Low grade squamous intraepithelial lesion on cytologic smear of cervix (LGSIL): Secondary | ICD-10-CM

## 2020-05-27 DIAGNOSIS — Z124 Encounter for screening for malignant neoplasm of cervix: Secondary | ICD-10-CM | POA: Insufficient documentation

## 2020-05-27 DIAGNOSIS — Z30014 Encounter for initial prescription of intrauterine contraceptive device: Secondary | ICD-10-CM

## 2020-05-27 MED ORDER — MISOPROSTOL 100 MCG PO TABS
100.0000 ug | ORAL_TABLET | Freq: Once | ORAL | 0 refills | Status: DC
Start: 2020-05-27 — End: 2021-01-02

## 2020-05-27 NOTE — Patient Instructions (Signed)
I value your feedback and you entrusting us with your care. If you get a [place] patient survey, I would appreciate you taking the time to let us know about your experience today. Thank you! ? ? ?

## 2020-05-27 NOTE — Telephone Encounter (Signed)
Pt is scheduled for Kyleena IUD insertion on 05/30/20 at 10:10 am with ABC

## 2020-05-30 ENCOUNTER — Ambulatory Visit: Payer: BLUE CROSS/BLUE SHIELD | Admitting: Obstetrics and Gynecology

## 2020-05-31 ENCOUNTER — Ambulatory Visit (INDEPENDENT_AMBULATORY_CARE_PROVIDER_SITE_OTHER): Payer: BC Managed Care – PPO | Admitting: Obstetrics

## 2020-05-31 ENCOUNTER — Encounter: Payer: Self-pay | Admitting: Obstetrics

## 2020-05-31 ENCOUNTER — Other Ambulatory Visit: Payer: Self-pay

## 2020-05-31 VITALS — BP 98/60 | HR 73 | Ht 67.0 in | Wt 110.0 lb

## 2020-05-31 DIAGNOSIS — Z30430 Encounter for insertion of intrauterine contraceptive device: Secondary | ICD-10-CM | POA: Diagnosis not present

## 2020-05-31 DIAGNOSIS — Z975 Presence of (intrauterine) contraceptive device: Secondary | ICD-10-CM | POA: Insufficient documentation

## 2020-05-31 LAB — CYTOLOGY - PAP
Chlamydia: NEGATIVE
Comment: NEGATIVE
Comment: NORMAL
Neisseria Gonorrhea: NEGATIVE
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
## 2020-06-02 ENCOUNTER — Ambulatory Visit: Payer: BC Managed Care – PPO | Admitting: Dermatology

## 2020-06-10 NOTE — Telephone Encounter (Signed)
Noted. Pt r/s to 05/31/20. Pt rcvd/charged [name] 05/31/20

## 2020-06-17 ENCOUNTER — Other Ambulatory Visit: Payer: Self-pay | Admitting: Obstetrics & Gynecology

## 2020-06-17 ENCOUNTER — Other Ambulatory Visit: Payer: Self-pay

## 2020-06-17 ENCOUNTER — Ambulatory Visit (INDEPENDENT_AMBULATORY_CARE_PROVIDER_SITE_OTHER): Payer: BC Managed Care – PPO

## 2020-06-17 ENCOUNTER — Encounter: Payer: Self-pay | Admitting: Obstetrics & Gynecology

## 2020-06-17 ENCOUNTER — Ambulatory Visit (INDEPENDENT_AMBULATORY_CARE_PROVIDER_SITE_OTHER): Payer: BC Managed Care – PPO | Admitting: Obstetrics & Gynecology

## 2020-06-17 VITALS — BP 120/80 | Ht 67.0 in | Wt 120.0 lb

## 2020-06-17 DIAGNOSIS — Z30431 Encounter for routine checking of intrauterine contraceptive device: Secondary | ICD-10-CM | POA: Diagnosis not present

## 2020-06-17 DIAGNOSIS — Z975 Presence of (intrauterine) contraceptive device: Secondary | ICD-10-CM | POA: Diagnosis not present

## 2020-06-17 NOTE — Progress Notes (Signed)
  History of Present Illness:  Stacy Montes is a 24 y.o. that had a [place] IUD placed approximately 2 weeks ago. Since that time, she states that she has had no bleeding and pain  PMHx: She  has a past medical history of Headache and No known health problems. Also,  has a past surgical history that includes Wisdom tooth extraction (05/2013) and Tonsillectomy and adenoidectomy (Bilateral, 05/04/2020)., family history includes Diabetes in her maternal grandmother.,  reports that she has never smoked. She has never used smokeless tobacco. She reports previous alcohol use. She reports that she does not use drugs. Current Meds  Medication Sig  . doxycycline (PERIOSTAT) 20 MG tablet Take 20 mg by mouth 2 (two) times daily.  . imiquimod (ALDARA) 5 % cream Apply topically 3 (three) times a week.  Marland Kitchen levonorgestrel (KYLEENA) 19.5 MG IUD by Intrauterine route once.  .  Also, has No Known Allergies..  Review of Systems  All other systems reviewed and are negative.   Physical Exam:  BP 120/80   Ht 5\' 7"  (1.702 m)   Wt 120 lb (54.4 kg)   LMP 05/26/2020   BMI 18.79 kg/m  Body mass index is 18.79 kg/m. Constitutional: Well nourished, well developed female in no acute distress.  Abdomen: diffusely non tender to palpation, non distended, and no masses, hernias Neuro: Grossly intact Psych:  Normal mood and affect.    ULTRASOUND REPORT Location: [facility]  Date of Service: 06/17/2020  Indications:IUD check  Findings:  The uterus is anteverted and measures 6.9 x 3.9 x 2.7 cm. Echo texture is homogenous without evidence of focal masses. The Endometrium measures 5.5 mm. The IUD is correctly placed within the uterus.  Right Ovary measures 2.7 x 2.5 x 1.5 cm. It is normal in appearance. Left Ovary measures 2.8 x 2.2 x 1.8 cm. It is normal in appearance. Survey of the adnexa demonstrates no adnexal masses. There is no free fluid in the cul de sac. Impression: 1. Normal pelvic ultrasound.   2. The IUD is correctly placed within the uterus.  Recommendations: 1.Clinical correlation with the patient's History and Physical Exam.   Assessment: IUD in proper location; pt doing well  Plan: She was told to continue to use barrier contraception, in order to prevent any STIs, and to take a home pregnancy test or call 06/19/2020 if she ever thinks she may be pregnant, and that her IUD expires in 5 years.  She was amenable to this plan and we will see her back in 1 year/PRN.  A total of 20 minutes were spent face-to-face with the patient as well as preparation, review, communication, and documentation during this encounter.   [place], MD, [facility], [facility] 06/17/2020  3:45 PM

## 2021-01-02 ENCOUNTER — Ambulatory Visit (INDEPENDENT_AMBULATORY_CARE_PROVIDER_SITE_OTHER): Payer: BC Managed Care – PPO | Admitting: Obstetrics and Gynecology

## 2021-01-02 ENCOUNTER — Other Ambulatory Visit: Payer: Self-pay

## 2021-01-02 ENCOUNTER — Encounter: Payer: Self-pay | Admitting: Obstetrics and Gynecology

## 2021-01-02 VITALS — BP 110/70 | Ht 67.0 in | Wt 132.0 lb

## 2021-01-02 DIAGNOSIS — F32A Depression, unspecified: Secondary | ICD-10-CM | POA: Insufficient documentation

## 2021-01-02 DIAGNOSIS — L309 Dermatitis, unspecified: Secondary | ICD-10-CM

## 2021-01-02 DIAGNOSIS — F419 Anxiety disorder, unspecified: Secondary | ICD-10-CM | POA: Diagnosis not present

## 2021-01-02 MED ORDER — DOXYCYCLINE HYCLATE 100 MG PO CAPS: 100.0000 mg | ORAL_CAPSULE | Freq: Two times a day (BID) | ORAL | 0 refills | Status: DC

## 2021-01-02 MED ORDER — ESCITALOPRAM OXALATE 10 MG PO TABS: ORAL_TABLET | ORAL | 1 refills | Status: DC
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
## 2021-01-03 ENCOUNTER — Telehealth: Payer: Self-pay

## 2021-01-03 DIAGNOSIS — F419 Anxiety disorder, unspecified: Secondary | ICD-10-CM

## 2021-01-03 NOTE — Telephone Encounter (Signed)
Pt calling; was seen yesterday for anxiety and given lexapro; took it today and now her face is swollen, red, hot, and burns; ? Allergic reaction.  [phone]

## 2021-01-03 NOTE — Telephone Encounter (Signed)
Most likely allergic rxn. Stop Rx, try benadryl for sx.

## 2021-01-03 NOTE — Telephone Encounter (Signed)
Pt aware. She wants to know what will she do after allergic reaction clears up? Will you send new Rx for her anxiety?

## 2021-01-04 MED ORDER — SERTRALINE HCL 50 MG PO TABS: ORAL_TABLET | ORAL | 1 refills | Status: DC

## 2021-01-04 NOTE — Telephone Encounter (Signed)
Pt says yes she is feeling better, slowly improving.

## 2021-01-04 NOTE — Telephone Encounter (Signed)
Pls call pt to see if sx for allergic rxn have improved. If so, will change to different SSRI.

## 2021-01-04 NOTE — Telephone Encounter (Signed)
Pt's mom Kim [place] called to check on Rx replacement. Will need new anxiety Rx.

## 2021-01-04 NOTE — Telephone Encounter (Signed)
Rx zoloft eRxd. Wait till sx fully resolved before starting. Again do 1/2 tab daily for 6 days, then 1 daily.

## 2021-01-05 NOTE — Telephone Encounter (Signed)
Pt aware.

## 2021-01-26 ENCOUNTER — Other Ambulatory Visit: Payer: Self-pay | Admitting: Obstetrics and Gynecology

## 2021-01-26 DIAGNOSIS — L309 Dermatitis, unspecified: Secondary | ICD-10-CM

## 2021-01-27 ENCOUNTER — Other Ambulatory Visit: Payer: Self-pay | Admitting: Obstetrics and Gynecology

## 2021-01-27 DIAGNOSIS — F419 Anxiety disorder, unspecified: Secondary | ICD-10-CM

## 2021-01-31 ENCOUNTER — Other Ambulatory Visit: Payer: Self-pay | Admitting: Obstetrics and Gynecology

## 2021-01-31 DIAGNOSIS — F419 Anxiety disorder, unspecified: Secondary | ICD-10-CM
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
## 2021-02-20 ENCOUNTER — Ambulatory Visit (INDEPENDENT_AMBULATORY_CARE_PROVIDER_SITE_OTHER): Payer: BC Managed Care – PPO | Admitting: Obstetrics and Gynecology

## 2021-02-20 ENCOUNTER — Encounter: Payer: Self-pay | Admitting: Obstetrics and Gynecology

## 2021-02-20 ENCOUNTER — Other Ambulatory Visit: Payer: Self-pay

## 2021-02-20 VITALS — BP 90/64 | Ht 67.0 in | Wt 132.0 lb

## 2021-02-20 DIAGNOSIS — F419 Anxiety disorder, unspecified: Secondary | ICD-10-CM

## 2021-02-20 DIAGNOSIS — F633 Trichotillomania: Secondary | ICD-10-CM

## 2021-02-20 DIAGNOSIS — F32A Depression, unspecified: Secondary | ICD-10-CM | POA: Diagnosis not present

## 2021-02-20 DIAGNOSIS — F424 Excoriation (skin-picking) disorder: Secondary | ICD-10-CM | POA: Insufficient documentation

## 2021-02-20 MED ORDER — SERTRALINE HCL 100 MG PO TABS: 100.0000 mg | ORAL_TABLET | Freq: Every day | ORAL | 0 refills | Status: DC

## 2021-03-01 ENCOUNTER — Other Ambulatory Visit: Payer: Self-pay | Admitting: Obstetrics and Gynecology

## 2021-03-01 DIAGNOSIS — F419 Anxiety disorder, unspecified: Secondary | ICD-10-CM

## 2021-03-08 ENCOUNTER — Ambulatory Visit: Payer: BC Managed Care – PPO | Admitting: Dermatology

## 2021-03-17 ENCOUNTER — Other Ambulatory Visit: Payer: Self-pay | Admitting: Obstetrics and Gynecology

## 2021-03-17 DIAGNOSIS — F419 Anxiety disorder, unspecified: Secondary | ICD-10-CM

## 2021-03-27 ENCOUNTER — Other Ambulatory Visit: Payer: Self-pay | Admitting: Obstetrics and Gynecology

## 2021-03-27 ENCOUNTER — Telehealth: Payer: Self-pay

## 2021-03-27 DIAGNOSIS — F419 Anxiety disorder, unspecified: Secondary | ICD-10-CM

## 2021-03-27 DIAGNOSIS — F32A Depression, unspecified: Secondary | ICD-10-CM

## 2021-03-27 NOTE — Telephone Encounter (Signed)
Pt calling for refill of zoloft 100mg .  [phone]

## 2021-03-28 ENCOUNTER — Ambulatory Visit (INDEPENDENT_AMBULATORY_CARE_PROVIDER_SITE_OTHER): Payer: BC Managed Care – PPO | Admitting: Obstetrics and Gynecology

## 2021-03-28 ENCOUNTER — Other Ambulatory Visit: Payer: Self-pay

## 2021-03-28 ENCOUNTER — Encounter: Payer: Self-pay | Admitting: Obstetrics and Gynecology

## 2021-03-28 DIAGNOSIS — F32A Depression, unspecified: Secondary | ICD-10-CM

## 2021-03-28 DIAGNOSIS — F419 Anxiety disorder, unspecified: Secondary | ICD-10-CM | POA: Diagnosis not present

## 2021-03-28 MED ORDER — SERTRALINE HCL 100 MG PO TABS
100.0000 mg | ORAL_TABLET | Freq: Every day | ORAL | 0 refills | Status: DC
Start: 2021-03-28 — End: 2021-05-29

## 2021-03-28 NOTE — Telephone Encounter (Signed)
Patient is scheduled for 03/28/21 with ABC
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
## 2021-05-26 NOTE — Progress Notes (Signed)
PCP:  [facility]   Chief Complaint  Patient presents with   Gynecologic Exam    Would like iron level tested, feels tired and often gets dizzy x yrs     HPI:      [name] is a 26 y.o. G0P0000 who LMP was Patient's last menstrual period was 05/24/2021 (exact date)., presents today for her annual examination.  Her menses are monthly, lasting 4-5 days, light flow, no BTB, mild dysmen. LMP was a little heavier than usual but still mod flow.  Normal CBC 10/20  Sex activity: currently sexually active--contraception IUD. Kyleena placed 05/31/20.  Had bleeding during sex (no pain) 4 days in a row a few wks ago that then resolved. Hadn't been sex active in awhile since beau out of [place]. Would like STD testing. Stopped OCPs in past due to decreased libido.  Last Pap: 05/27/20 and 04/22/19 Results were LGSIL; repeat pap due today due to age Hx of STDs: none  There is no FH of breast cancer. There is no FH of ovarian cancer. The patient does self-breast exams.  Tobacco use: The patient denies current or previous tobacco use. Alcohol use: occas No drug use.  Exercise: very active  She does get adequate calcium but not Vitamin D in her diet. Thinks she had Gardasil vaccine.  Hx of anxiety for several yrs, but worse last yr. Hx of fidgeting with hair and scalp picking to the point of bleeding, with worry, occas vomiting due to anxiety, occas panic attacks, irritability, feels numb sometimes. 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 Exercises daily and feels better during exercise. Sometimes has trouble sleeping, but other times sleep well. No SI. Started on lexapro 4/22 but had allergic rxn. Changed to zoloft and now on 100 mg dose with sx improvement, no side effects. Also seeing therapist. Doing much better.  FH anxiety in mom/MGM, mom on lexapro with sx relief.   Hx of fatigue for years, sleeping ~7hrs nightly. Sometimes with dizziness. Normal CBC 10/20.    Patient Active Problem List   Diagnosis Date Noted   Excoriation (skin-picking) disorder 02/20/2021   Anxiety and depression 01/02/2021   IUD contraception 05/31/2020   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    Past Surgical History:  Procedure Laterality Date   TONSILLECTOMY AND ADENOIDECTOMY Bilateral 05/04/2020   Procedure: TONSILLECTOMY AND ADENOIDECTOMY;  Surgeon: Carloyn Manner, MD;  Location: [place];  Service: ENT;  Laterality: Bilateral;  NO ADENOIDS SENT FOR SPECIMEN ONLY CAUTERIZED   WISDOM TOOTH EXTRACTION  05/2013    Family History  Problem Relation Age of Onset   Diabetes Maternal Grandmother        TYPE 2    Social History   Socioeconomic History   Marital status: Single    Spouse name: Not on file   Number of children: 0   Years of education: Not on file   Highest education level: Not on file  Occupational History   Not on file  Tobacco Use   Smoking status: Never   Smokeless tobacco: Never  Vaping Use   Vaping Use: Never used  Substance and Sexual Activity   Alcohol use: Not Currently    Comment: rare   Drug use: No   Sexual activity: Yes    Birth control/protection: I.U.D.    Comment: Kyleena  Other Topics Concern   Not on file  Social History Narrative   Not on file   Social Determinants  of Health   Financial Resource Strain: Not on file  Food Insecurity: Not on file  Transportation Needs: Not on file  Physical Activity: Not on file  Stress: Not on file  Social Connections: Not on file  Intimate Partner Violence: Not on file     Current Outpatient Medications:    levonorgestrel (KYLEENA) 19.5 MG IUD, by Intrauterine route once., Disp: , Rfl:    doxycycline (VIBRAMYCIN) 100 MG capsule, Take 100 mg by mouth 2 (two) times daily. (Patient not taking: Reported on 05/29/2021), Disp: , Rfl:    sertraline (ZOLOFT) 100 MG tablet, Take 1 tablet (100 mg total) by mouth daily., Disp: 90 tablet, Rfl: 3     ROS:  Review of  Systems  Constitutional:  Positive for fatigue. Negative for fever and unexpected weight change.  Respiratory:  Negative for cough, shortness of breath and wheezing.   Cardiovascular:  Negative for chest pain, palpitations and leg swelling.  Gastrointestinal:  Negative for blood in stool, constipation, diarrhea, nausea and vomiting.  Endocrine: Negative for cold intolerance, heat intolerance and polyuria.  Genitourinary:  Negative for dyspareunia, dysuria, flank pain, frequency, genital sores, hematuria, menstrual problem, pelvic pain, urgency, vaginal bleeding, vaginal discharge and vaginal pain.  Musculoskeletal:  Negative for back pain, joint swelling and myalgias.  Skin:  Negative for rash.  Neurological:  Positive for dizziness. Negative for syncope, light-headedness, numbness and headaches.  Hematological:  Negative for adenopathy.  Psychiatric/Behavioral:  Positive for dysphoric mood. Negative for agitation, confusion, sleep disturbance and suicidal ideas. The patient is not nervous/anxious.   BREAST: No symptoms   Objective: BP 110/60    Ht 5\' 7"  (1.702 m)    Wt 137 lb (62.1 kg)    LMP 05/24/2021 (Exact Date)    BMI 21.46 kg/m    Physical Exam Constitutional:      Appearance: She is well-developed.  Genitourinary:     Vulva normal.     Right Labia: No rash, tenderness or lesions.    Left Labia: No tenderness, lesions or rash.    No vaginal discharge, erythema or tenderness.      Right Adnexa: not tender and no mass present.    Left Adnexa: not tender and no mass present.    No cervical friability or polyp.     IUD strings visualized.     Uterus is not enlarged or tender.  Breasts:    Right: No mass, nipple discharge, skin change or tenderness.     Left: No mass, nipple discharge, skin change or tenderness.  Neck:     Thyroid: No thyromegaly.  Cardiovascular:     Rate and Rhythm: Normal rate and regular rhythm.     Heart sounds: Normal heart sounds. No murmur  heard. Pulmonary:     Effort: Pulmonary effort is normal.     Breath sounds: Normal breath sounds.  Abdominal:     Palpations: Abdomen is soft.     Tenderness: There is no abdominal tenderness. There is no guarding or rebound.  Musculoskeletal:        General: Normal range of motion.     Cervical back: Normal range of motion.  Lymphadenopathy:     Cervical: No cervical adenopathy.  Neurological:     General: No focal deficit present.     Mental Status: She is alert and oriented to person, place, and time.     Cranial Nerves: No cranial nerve deficit.  Skin:    General: Skin is warm and dry.  Psychiatric:  Mood and Affect: Mood normal.        Behavior: Behavior normal.        Thought Content: Thought content normal.        Judgment: Judgment normal.  Vitals reviewed.   GAD 7 : Generalized Anxiety Score 05/29/2021 03/28/2021 02/20/2021 01/02/2021  Nervous, Anxious, on Edge 1 1 3 3   Control/stop worrying 0 1 3 3   Worry too much - different things 1 1 3 3   Trouble relaxing 0 1 3 3   Restless 0 0 0 2  Easily annoyed or irritable 1 2 2 3   Afraid - awful might happen 0 0 0 0  Total GAD 7 Score 3 6 14 17   Anxiety Difficulty Somewhat difficult Not difficult at all Somewhat difficult Very difficult    Depression screen [facility] 2/9 05/29/2021  Decreased Interest 0  Down, Depressed, Hopeless 1  PHQ - 2 Score 1  Altered sleeping 0  Tired, decreased energy 1  Change in appetite 0  Feeling bad or failure about yourself  0  Trouble concentrating 0  Moving slowly or fidgety/restless 1  Suicidal thoughts 0  PHQ-9 Score 3  Difficult doing work/chores Somewhat difficult    Assessment/Plan: Encounter for annual routine gynecological examination  Cervical cancer screening - Plan: Cytology - PAP   Screening for STD (sexually transmitted disease) - Plan: HIV Antibody (routine testing w rflx), RPR, Hepatitis C antibody, Cytology - PAP  Screening for HPV (human papillomavirus) - Plan:  Cytology - PAP  LGSIL on Pap smear of cervix - Plan: Cytology - PAP, repeat pap today. Will f/u with results.  Encounter for routine checking of intrauterine contraceptive device (IUD); IUD strings in cx os; due for removal 1/27  Anxiety and depression - Plan: sertraline (ZOLOFT) 100 MG tablet; sx improved, doing well. Rx RF. F/u prn. Cont seeing therapist.  Excoriation (skin-picking) disorder - Plan: sertraline (ZOLOFT) 100 MG tablet  Trichotillomania - Plan: sertraline (ZOLOFT) 100 MG tablet  Chronic fatigue - Plan: CBC with Differential/Platelet; check labs. Will f/u with results. Increase Vit D, may need more sleep if labs WNL.   Meds ordered this encounter  Medications   sertraline (ZOLOFT) 100 MG tablet    Sig: Take 1 tablet (100 mg total) by mouth daily.    Dispense:  90 tablet    Refill:  3    Order Specific Question:   Supervising Provider    Answer:   Gae Dry [id number]     GYN counsel adequate intake of calcium and vitamin D, diet and exercise,      F/U  Return in about 1 year (around 05/29/2022).  Tatelyn Vanhecke B. Wilgus Deyton, PA-C 05/29/2021 4:42 PM

## 2021-05-29 ENCOUNTER — Other Ambulatory Visit (HOSPITAL_COMMUNITY)
Admission: RE | Admit: 2021-05-29 | Discharge: 2021-05-29 | Disposition: A | Discharge status: Home / Self Care | Payer: BC Managed Care – PPO | Source: Ambulatory Visit | Attending: Obstetrics and Gynecology | Admitting: Obstetrics and Gynecology

## 2021-05-29 ENCOUNTER — Other Ambulatory Visit: Payer: Self-pay

## 2021-05-29 ENCOUNTER — Encounter: Payer: Self-pay | Admitting: Obstetrics and Gynecology

## 2021-05-29 ENCOUNTER — Ambulatory Visit (INDEPENDENT_AMBULATORY_CARE_PROVIDER_SITE_OTHER): Payer: BC Managed Care – PPO | Admitting: Obstetrics and Gynecology

## 2021-05-29 VITALS — BP 110/60 | Ht 67.0 in | Wt 137.0 lb

## 2021-05-29 DIAGNOSIS — Z1151 Encounter for screening for human papillomavirus (HPV): Secondary | ICD-10-CM | POA: Insufficient documentation

## 2021-05-29 DIAGNOSIS — F419 Anxiety disorder, unspecified: Secondary | ICD-10-CM

## 2021-05-29 DIAGNOSIS — R5382 Chronic fatigue, unspecified: Secondary | ICD-10-CM

## 2021-05-29 DIAGNOSIS — R87612 Low grade squamous intraepithelial lesion on cytologic smear of cervix (LGSIL): Secondary | ICD-10-CM

## 2021-05-29 DIAGNOSIS — F633 Trichotillomania: Secondary | ICD-10-CM

## 2021-05-29 DIAGNOSIS — F424 Excoriation (skin-picking) disorder: Secondary | ICD-10-CM

## 2021-05-29 DIAGNOSIS — F32A Depression, unspecified: Secondary | ICD-10-CM

## 2021-05-29 DIAGNOSIS — Z124 Encounter for screening for malignant neoplasm of cervix: Secondary | ICD-10-CM | POA: Insufficient documentation

## 2021-05-29 DIAGNOSIS — Z113 Encounter for screening for infections with a predominantly sexual mode of transmission: Secondary | ICD-10-CM

## 2021-05-29 DIAGNOSIS — Z01419 Encounter for gynecological examination (general) (routine) without abnormal findings: Secondary | ICD-10-CM

## 2021-05-29 DIAGNOSIS — Z30431 Encounter for routine checking of intrauterine contraceptive device: Secondary | ICD-10-CM

## 2021-05-29 MED ORDER — SERTRALINE HCL 100 MG PO TABS: 100.0000 mg | ORAL_TABLET | Freq: Every day | ORAL | 3 refills | Status: DC

## 2021-05-30 ENCOUNTER — Telehealth: Payer: Self-pay

## 2021-05-30 LAB — RPR: RPR Ser Ql: NONREACTIVE

## 2021-05-30 LAB — CBC WITH DIFFERENTIAL/PLATELET
Basophils Absolute: 0.1 10*3/uL (ref 0.0–0.2)
Basos: 1 %
EOS (ABSOLUTE): 0.1 10*3/uL (ref 0.0–0.4)
Eos: 1 %
Hematocrit: 43.7 % (ref 34.0–46.6)
Hemoglobin: 14.6 g/dL (ref 11.1–15.9)
Immature Grans (Abs): 0 10*3/uL (ref 0.0–0.1)
Immature Granulocytes: 0 %
Lymphocytes Absolute: 2.1 10*3/uL (ref 0.7–3.1)
Lymphs: 35 %
MCH: 31.3 pg (ref 26.6–33.0)
MCHC: 33.4 g/dL (ref 31.5–35.7)
MCV: 94 fL (ref 79–97)
Monocytes Absolute: 0.6 10*3/uL (ref 0.1–0.9)
Monocytes: 10 %
Neutrophils Absolute: 3.2 10*3/uL (ref 1.4–7.0)
Neutrophils: 53 %
Platelets: 313 10*3/uL (ref 150–450)
RBC: 4.67 x10E6/uL (ref 3.77–5.28)
RDW: 12.8 % (ref 11.7–15.4)
WBC: 6 10*3/uL (ref 3.4–10.8)

## 2021-05-30 LAB — HEPATITIS C ANTIBODY: Hep C Virus Ab: <0.1 s/co ratio (ref 0.0–0.9)

## 2021-05-30 LAB — HIV ANTIBODY (ROUTINE TESTING W REFLEX): HIV Screen 4th Generation wRfx: NONREACTIVE

## 2021-05-30 NOTE — Telephone Encounter (Signed)
Pls give pt results and results info I put in. Pls help pt with Mychart activation (can giver her username and change her PW). thx

## 2021-05-30 NOTE — Telephone Encounter (Signed)
Pt aware. Pt approved resetting her password. Password re-setted and pt was able to log in to her mychart.

## 2021-06-01 LAB — CYTOLOGY - PAP
Chlamydia: NEGATIVE
Comment: NEGATIVE
Comment: NEGATIVE
Comment: NEGATIVE
Comment: NORMAL
Diagnosis: NEGATIVE
High risk HPV: POSITIVE — AB
Neisseria Gonorrhea: NEGATIVE
Trichomonas: NEGATIVE

## 2021-06-28 ENCOUNTER — Telehealth: Payer: Self-pay

## 2021-06-28 NOTE — Telephone Encounter (Signed)
Patient hired for Therapist, sports at [employer]. She had a tb skin test done. She needs it read. Inquiring if she can see ABC for this. Cb#[phone]

## 2021-06-28 NOTE — Telephone Encounter (Signed)
LMVM to notify this is not something we typically do as we are not PCP. Advised she may want to check with PCP (usually no copay for PCP, but we are specialty). Also suggest can check [facility] may be more cost effective/quick. Return call if she still would like to schedule with [place].

## 2021-10-31 ENCOUNTER — Telehealth: Payer: Self-pay

## 2021-10-31 NOTE — Telephone Encounter (Signed)
Pt calling wanting to speak with ABC about lowering her zoloft dosage. She is wanting to come off of it but she knows she needs to taper off. Can you advise on what pt should do?

## 2021-10-31 NOTE — Telephone Encounter (Signed)
Take 1/2 tab (50 mg dose) for 2 wks, then 1/4 tab (25 mg dose, if she can cut in quarters. If not, I can Rx a few pills to wean off) for 2 wks, then stop. F/u prn.

## 2022-01-31 ENCOUNTER — Encounter: Payer: Self-pay | Admitting: Obstetrics

## 2022-01-31 ENCOUNTER — Ambulatory Visit (INDEPENDENT_AMBULATORY_CARE_PROVIDER_SITE_OTHER): Payer: 59 | Admitting: Obstetrics

## 2022-01-31 ENCOUNTER — Other Ambulatory Visit (HOSPITAL_COMMUNITY)
Admission: RE | Admit: 2022-01-31 | Discharge: 2022-01-31 | Disposition: A | Discharge status: Home / Self Care | Payer: 59 | Source: Ambulatory Visit | Attending: Obstetrics | Admitting: Obstetrics

## 2022-01-31 VITALS — BP 118/70 | Ht 67.0 in | Wt 130.0 lb

## 2022-01-31 DIAGNOSIS — Z113 Encounter for screening for infections with a predominantly sexual mode of transmission: Secondary | ICD-10-CM | POA: Diagnosis not present

## 2022-01-31 DIAGNOSIS — B3731 Acute candidiasis of vulva and vagina: Secondary | ICD-10-CM | POA: Diagnosis not present
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
## 2022-02-01 LAB — HEP, RPR, HIV PANEL
HIV Screen 4th Generation wRfx: NONREACTIVE
Hepatitis B Surface Ag: NEGATIVE
RPR Ser Ql: NONREACTIVE

## 2022-02-02 ENCOUNTER — Encounter: Payer: Self-pay | Admitting: Obstetrics

## 2022-02-02 DIAGNOSIS — B379 Candidiasis, unspecified: Secondary | ICD-10-CM

## 2022-02-02 LAB — CERVICOVAGINAL ANCILLARY ONLY
Bacterial Vaginitis (gardnerella): NEGATIVE
Candida Glabrata: NEGATIVE
Candida Vaginitis: POSITIVE — AB
Chlamydia: NEGATIVE
Comment: NEGATIVE
Comment: NEGATIVE
Comment: NEGATIVE
Comment: NEGATIVE
Comment: NEGATIVE
Comment: NORMAL
Neisseria Gonorrhea: NEGATIVE
Trichomonas: NEGATIVE

## 2022-02-05 ENCOUNTER — Other Ambulatory Visit: Payer: Self-pay

## 2022-02-05 DIAGNOSIS — Z113 Encounter for screening for infections with a predominantly sexual mode of transmission: Secondary | ICD-10-CM

## 2022-02-05 MED ORDER — FLUCONAZOLE 150 MG PO TABS: 150.0000 mg | ORAL_TABLET | Freq: Once | ORAL | 0 refills | Status: AC

## 2022-02-05 NOTE — Progress Notes (Signed)
Order placed for HSV add on testing per patient request.

## 2022-02-13 ENCOUNTER — Encounter: Payer: Self-pay | Admitting: Obstetrics

## 2022-02-22 ENCOUNTER — Other Ambulatory Visit: Payer: BC Managed Care – PPO

## 2022-02-22 DIAGNOSIS — B009 Herpesviral infection, unspecified: Secondary | ICD-10-CM

## 2022-02-23 ENCOUNTER — Encounter: Payer: Self-pay | Admitting: Obstetrics

## 2022-02-23 LAB — HSV 1 AND 2 AB, IGG
HSV 1 Glycoprotein G Ab, IgG: <0.91 index (ref 0.00–0.90)
HSV 2 IgG, Type Spec: <0.91 index (ref 0.00–0.90)

## 2022-03-01 ENCOUNTER — Encounter: Payer: Self-pay | Admitting: Obstetrics

## 2022-04-25 ENCOUNTER — Encounter: Payer: Self-pay | Admitting: Obstetrics and Gynecology

## 2022-07-21 ENCOUNTER — Other Ambulatory Visit: Payer: Self-pay | Admitting: Obstetrics and Gynecology

## 2022-07-21 DIAGNOSIS — F633 Trichotillomania: Secondary | ICD-10-CM

## 2022-07-21 DIAGNOSIS — F424 Excoriation (skin-picking) disorder: Secondary | ICD-10-CM

## 2022-07-21 DIAGNOSIS — F419 Anxiety disorder, unspecified: Secondary | ICD-10-CM

## 2022-09-13 ENCOUNTER — Encounter: Payer: Self-pay | Admitting: Obstetrics and Gynecology

## 2022-09-13 ENCOUNTER — Other Ambulatory Visit (HOSPITAL_COMMUNITY)
Admission: RE | Admit: 2022-09-13 | Discharge: 2022-09-13 | Disposition: A | Discharge status: Home / Self Care | Payer: 59 | Source: Ambulatory Visit | Attending: Obstetrics and Gynecology | Admitting: Obstetrics and Gynecology

## 2022-09-13 ENCOUNTER — Ambulatory Visit (INDEPENDENT_AMBULATORY_CARE_PROVIDER_SITE_OTHER): Payer: 59 | Admitting: Obstetrics and Gynecology

## 2022-09-13 VITALS — BP 110/75 | HR 76 | Ht 67.0 in | Wt 135.5 lb

## 2022-09-13 DIAGNOSIS — Z01419 Encounter for gynecological examination (general) (routine) without abnormal findings: Secondary | ICD-10-CM | POA: Diagnosis present

## 2022-09-13 DIAGNOSIS — Z1151 Encounter for screening for human papillomavirus (HPV): Secondary | ICD-10-CM

## 2022-09-13 DIAGNOSIS — Z124 Encounter for screening for malignant neoplasm of cervix: Secondary | ICD-10-CM | POA: Insufficient documentation

## 2022-09-13 DIAGNOSIS — Z113 Encounter for screening for infections with a predominantly sexual mode of transmission: Secondary | ICD-10-CM | POA: Insufficient documentation

## 2022-09-13 NOTE — Progress Notes (Signed)
HPI:      [name] is a 27 y.o. G0P0000 who LMP was No LMP recorded. (Menstrual status: IUD).  Subjective:   She presents today for her annual examination.  She has no complaints.  She has an IUD [name]) and has no menses with it.  She does not check her strings. Of significant note, patient has had previous abnormal Pap smears including positive HPV.  No previous colposcopies were performed.  Her last Pap smear showed normal cytology but positive HPV. 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    Hx: The following portions of the patient's history were reviewed and updated as appropriate:             She  has a past medical history of Headache and No known health problems. She does not have any pertinent problems on file. She  has a past surgical history that includes Wisdom tooth extraction (05/2013) and Tonsillectomy and adenoidectomy (Bilateral, 05/04/2020). Her family history includes Diabetes in her maternal grandmother. She  reports that she has never smoked. She has never used smokeless tobacco. She reports current alcohol use. She reports that she does not use drugs. She has a current medication list which includes the following prescription(s): kyleena and sertraline. She is allergic to lexapro [escitalopram oxalate].       Review of Systems:  Review of Systems  Constitutional: Denied constitutional symptoms, night sweats, recent illness, fatigue, fever, insomnia and weight loss.  Eyes: Denied eye symptoms, eye pain, photophobia, vision change and visual disturbance.  Ears/Nose/Throat/Neck: Denied ear, nose, throat or neck symptoms, hearing loss, nasal discharge, sinus congestion and sore throat.  Cardiovascular: Denied cardiovascular symptoms, arrhythmia, chest pain/pressure, edema, exercise intolerance, orthopnea and palpitations.  Respiratory: Denied pulmonary symptoms, asthma, pleuritic pain, productive sputum, cough, dyspnea and wheezing.  Gastrointestinal: Denied,  gastro-esophageal reflux, melena, nausea and vomiting.  Genitourinary: Denied genitourinary symptoms including symptomatic vaginal discharge, pelvic relaxation issues, and urinary complaints.  Musculoskeletal: Denied musculoskeletal symptoms, stiffness, swelling, muscle weakness and myalgia.  Dermatologic: Denied dermatology symptoms, rash and scar.  Neurologic: Denied neurology symptoms, dizziness, headache, neck pain and syncope.  Psychiatric: Denied psychiatric symptoms, anxiety and depression.  Endocrine: Denied endocrine symptoms including hot flashes and night sweats.   Meds:   Current Outpatient Medications on File Prior to Visit  Medication Sig Dispense Refill   levonorgestrel (KYLEENA) 19.5 MG IUD by Intrauterine route once.     sertraline (ZOLOFT) 100 MG tablet Take 1 tablet (100 mg total) by mouth daily. 90 tablet 3   No current facility-administered medications on file prior to visit.     Objective:     Vitals:   09/13/22 0929  BP: 110/75  Pulse: 76    Filed Weights   09/13/22 0929  Weight: 135 lb 8 oz (61.5 kg)              Physical examination General NAD, Conversant  HEENT Atraumatic; Op clear with mmm.  Normo-cephalic. Pupils reactive. Anicteric sclerae  Thyroid/Neck Smooth without nodularity or enlargement. Normal ROM.  Neck Supple.  Skin No rashes, lesions or ulceration. Normal palpated skin turgor. No nodularity.  Breasts: No masses or discharge.  Symmetric.  No axillary adenopathy.  Lungs: Clear to auscultation.No rales or wheezes. Normal Respiratory effort, no retractions.  Heart: NSR.  No murmurs or rubs appreciated. No peripheral edema  Abdomen: Soft.  Non-tender.  No masses.  No HSM. No hernia  Extremities: Moves all appropriately.  Normal ROM for age.  No lymphadenopathy.  Neuro: Oriented to PPT.  Normal mood. Normal affect.     Pelvic:   Vulva: Normal appearance.  No lesions.  Vagina: No lesions or abnormalities noted.  Support: Normal pelvic  support.  Urethra No masses tenderness or scarring.  Meatus Normal size without lesions or prolapse.  Cervix: Normal appearance.  No lesions.  IUD strings noted  Anus: Normal exam.  No lesions.  Perineum: Normal exam.  No lesions.        Bimanual   Uterus: Normal size.  Non-tender.  Mobile.  AV.  Adnexae: No masses.  Non-tender to palpation.  Cul-de-sac: Negative for abnormality.     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 Well woman exam with routine gynecological exam   2. Cervical cancer screening   3. Screening for HPV (human papillomavirus)   4. Screening for STD (sexually transmitted disease)        Plan:            1.  Basic Screening Recommendations The basic screening recommendations for asymptomatic women were discussed with the patient during her visit.  The age-appropriate recommendations were discussed with her and the rational for the tests reviewed.  When I am informed by the patient that another primary care physician has previously obtained the age-appropriate tests and they are up-to-date, only outstanding tests are ordered and referrals given as necessary.  Abnormal results of tests will be discussed with her when all of her results are completed.  Routine preventative health maintenance measures emphasized: Exercise/Diet/Weight control, Tobacco Warnings, Alcohol/Substance use risks and Stress Management Pap with viral typing performed -STD screening 2.  Have advised colposcopy if HPV positive. Orders No orders of the defined types were placed in this encounter.   No orders of the defined types were placed in this encounter.         F/U  Return in about 1 year (around 09/13/2023) for Annual Physical.  Elonda Husky, M.D. 09/13/2022 10:02 AM

## 2022-09-13 NOTE — Progress Notes (Signed)
Patients presents for annual exam today. She states doing well with current IUD. She is due for pap smear, ordered. She states no other questions or concerns at this time.

## 2022-09-18 LAB — CYTOLOGY - PAP
Chlamydia: NEGATIVE
Comment: NEGATIVE
Comment: NEGATIVE
Comment: NEGATIVE
Comment: NORMAL
Diagnosis: NEGATIVE
High risk HPV: NEGATIVE
Neisseria Gonorrhea: NEGATIVE
Trichomonas: NEGATIVE

## 2022-11-20 ENCOUNTER — Encounter: Payer: Self-pay | Admitting: Obstetrics and Gynecology

## 2022-11-20 DIAGNOSIS — F424 Excoriation (skin-picking) disorder: Secondary | ICD-10-CM

## 2022-11-20 DIAGNOSIS — F32A Depression, unspecified: Secondary | ICD-10-CM

## 2022-11-20 DIAGNOSIS — F633 Trichotillomania: Secondary | ICD-10-CM

## 2022-11-26 MED ORDER — SERTRALINE HCL 100 MG PO TABS: 100.0000 mg | ORAL_TABLET | Freq: Every day | ORAL | 2 refills | Status: DC

## 2022-12-18 NOTE — Progress Notes (Signed)
    NURSE VISIT NOTE  Subjective:    Patient ID: Stacy Montes, female    DOB: Aug 18, 1995, 27 y.o.   MRN: [id number]  HPI  Patient is a 27 y.o. G0P0000 female who presents for {pe vag discharge desc:315065} vaginal discharge for *** {gen duration:315003}. Denies abnormal vaginal bleeding or significant pelvic pain or fever. {Actions; denies/reports/admits to:19208} {UTI Symptoms:210800002}. Patient {has/denies:315300} history of known exposure to STD.   Objective:    There were no vitals taken for this visit.   @THIS  VISIT ONLY@  Assessment:   No diagnosis found.  {vaginitis type:315262}  Plan:   GC and chlamydia DNA  probe sent to lab. Treatment: {vaginitis tx:315263} ROV prn if symptoms persist or worsen.   Loney Laurence, CMA

## 2022-12-19 ENCOUNTER — Other Ambulatory Visit (HOSPITAL_COMMUNITY)
Admission: RE | Admit: 2022-12-19 | Discharge: 2022-12-19 | Disposition: A | Discharge status: Home / Self Care | Payer: BC Managed Care – PPO | Source: Ambulatory Visit | Attending: Obstetrics and Gynecology | Admitting: Obstetrics and Gynecology

## 2022-12-19 ENCOUNTER — Ambulatory Visit (INDEPENDENT_AMBULATORY_CARE_PROVIDER_SITE_OTHER): Payer: 59

## 2022-12-19 VITALS — BP 110/73 | HR 95 | Ht 67.0 in | Wt 132.0 lb

## 2022-12-19 DIAGNOSIS — N898 Other specified noninflammatory disorders of vagina: Secondary | ICD-10-CM

## 2022-12-19 DIAGNOSIS — Z3042 Encounter for surveillance of injectable contraceptive: Secondary | ICD-10-CM

## 2022-12-20 ENCOUNTER — Other Ambulatory Visit: Payer: Self-pay

## 2022-12-20 DIAGNOSIS — B9689 Other specified bacterial agents as the cause of diseases classified elsewhere: Secondary | ICD-10-CM

## 2022-12-20 MED ORDER — METRONIDAZOLE 500 MG PO TABS: 500.0000 mg | ORAL_TABLET | Freq: Two times a day (BID) | ORAL | 0 refills | Status: DC

## 2022-12-20 MED ORDER — FLUCONAZOLE 150 MG PO TABS: 150.0000 mg | ORAL_TABLET | Freq: Once | ORAL | 0 refills | Status: AC

## 2023-08-20 ENCOUNTER — Ambulatory Visit: Payer: 59 | Admitting: Dermatology

## 2023-11-22 ENCOUNTER — Other Ambulatory Visit: Payer: Self-pay | Admitting: Obstetrics and Gynecology

## 2023-11-22 DIAGNOSIS — F419 Anxiety disorder, unspecified: Secondary | ICD-10-CM

## 2024-03-08 ENCOUNTER — Ambulatory Visit
Admission: EM | Admit: 2024-03-08 | Discharge: 2024-03-08 | Disposition: A | Discharge status: Home / Self Care | Attending: Emergency Medicine | Admitting: Emergency Medicine

## 2024-03-08 DIAGNOSIS — H6692 Otitis media, unspecified, left ear: Secondary | ICD-10-CM

## 2024-03-08 MED ORDER — AMOXICILLIN 875 MG PO TABS: 875.0000 mg | ORAL_TABLET | Freq: Two times a day (BID) | ORAL | 0 refills | Status: AC
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
## 2024-03-08 NOTE — ED Triage Notes (Addendum)
 Patient to Urgent Care with complaints of L sided ear pain w/ clear drainage. Reports a swishing sound when she moves her head.   Symptoms started during the right. Recent Covid and Flu.   Meds: ibuprofen @5am .

## 2024-03-08 NOTE — Discharge Instructions (Addendum)
 Take the amoxicillin as directed.  Follow up with your primary care provider.

## 2024-03-09 ENCOUNTER — Ambulatory Visit

## 2024-04-02 ENCOUNTER — Other Ambulatory Visit: Payer: Self-pay

## 2024-04-02 ENCOUNTER — Emergency Department

## 2024-04-02 ENCOUNTER — Encounter: Payer: Self-pay | Admitting: Emergency Medicine

## 2024-04-02 ENCOUNTER — Emergency Department
Admission: EM | Admit: 2024-04-02 | Discharge: 2024-04-03 | Disposition: A | Discharge status: Home / Self Care | Attending: Emergency Medicine | Admitting: Emergency Medicine

## 2024-04-02 ENCOUNTER — Other Ambulatory Visit: Payer: Self-pay | Admitting: Licensed Practical Nurse

## 2024-04-02 ENCOUNTER — Telehealth: Payer: Self-pay

## 2024-04-02 DIAGNOSIS — O3680X0 Pregnancy with inconclusive fetal viability, not applicable or unspecified: Secondary | ICD-10-CM

## 2024-04-02 DIAGNOSIS — O009 Unspecified ectopic pregnancy without intrauterine pregnancy: Secondary | ICD-10-CM | POA: Insufficient documentation

## 2024-04-02 DIAGNOSIS — R1032 Left lower quadrant pain: Secondary | ICD-10-CM | POA: Diagnosis not present

## 2024-04-02 DIAGNOSIS — Z975 Presence of (intrauterine) contraceptive device: Secondary | ICD-10-CM | POA: Diagnosis not present

## 2024-04-02 DIAGNOSIS — Z3A00 Weeks of gestation of pregnancy not specified: Secondary | ICD-10-CM | POA: Diagnosis not present

## 2024-04-02 DIAGNOSIS — N939 Abnormal uterine and vaginal bleeding, unspecified: Secondary | ICD-10-CM

## 2024-04-02 DIAGNOSIS — O209 Hemorrhage in early pregnancy, unspecified: Secondary | ICD-10-CM | POA: Diagnosis present

## 2024-04-02 LAB — CBC WITH DIFFERENTIAL/PLATELET
Abs Immature Granulocytes: 0.01 K/uL (ref 0.00–0.07)
Basophils Absolute: 0.1 K/uL (ref 0.0–0.1)
Basophils Relative: 1 %
Eosinophils Absolute: 0.2 K/uL (ref 0.0–0.5)
Eosinophils Relative: 3 %
HCT: 41.5 % (ref 36.0–46.0)
Hemoglobin: 13.4 g/dL (ref 12.0–15.0)
Immature Granulocytes: 0 %
Lymphocytes Relative: 43 %
Lymphs Abs: 3.5 K/uL (ref 0.7–4.0)
MCH: 29.4 pg (ref 26.0–34.0)
MCHC: 32.3 g/dL (ref 30.0–36.0)
MCV: 91 fL (ref 80.0–100.0)
Monocytes Absolute: 0.6 K/uL (ref 0.1–1.0)
Monocytes Relative: 7 %
Neutro Abs: 3.8 K/uL (ref 1.7–7.7)
Neutrophils Relative %: 46 %
Platelets: 340 K/uL (ref 150–400)
RBC: 4.56 MIL/uL (ref 3.87–5.11)
RDW: 12.8 % (ref 11.5–15.5)
WBC: 8.2 K/uL (ref 4.0–10.5)
nRBC: 0 % (ref 0.0–0.2)

## 2024-04-02 LAB — URINALYSIS, ROUTINE W REFLEX MICROSCOPIC
Bacteria, UA: NONE SEEN
Bilirubin Urine: NEGATIVE
Glucose, UA: NEGATIVE mg/dL
Ketones, ur: NEGATIVE mg/dL
Leukocytes,Ua: NEGATIVE
Nitrite: NEGATIVE
Protein, ur: NEGATIVE mg/dL
Specific Gravity, Urine: 1.023 (ref 1.005–1.030)
pH: 5 (ref 5.0–8.0)

## 2024-04-02 LAB — COMPREHENSIVE METABOLIC PANEL WITH GFR
ALT: 20 U/L (ref 0–44)
AST: 18 U/L (ref 15–41)
Albumin: 4.7 g/dL (ref 3.5–5.0)
Alkaline Phosphatase: 54 U/L (ref 38–126)
Anion gap: 14 (ref 5–15)
BUN: 17 mg/dL (ref 6–20)
CO2: 22 mmol/L (ref 22–32)
Calcium: 9.3 mg/dL (ref 8.9–10.3)
Chloride: 103 mmol/L (ref 98–111)
Creatinine, Ser: 0.7 mg/dL (ref 0.44–1.00)
GFR, Estimated: >60 mL/min (ref >60)
Glucose, Bld: 96 mg/dL (ref 70–99)
Potassium: 3.6 mmol/L (ref 3.5–5.1)
Sodium: 139 mmol/L (ref 135–145)
Total Bilirubin: 0.5 mg/dL (ref 0.0–1.2)
Total Protein: 7.6 g/dL (ref 6.5–8.1)

## 2024-04-02 LAB — POC URINE PREG, ED: Preg Test, Ur: POSITIVE — AB

## 2024-04-02 LAB — HCG, QUANTITATIVE, PREGNANCY: hCG, Beta Chain, Quant, S: 155 m[IU]/mL — ABNORMAL HIGH (ref <5)

## 2024-04-02 LAB — ABO/RH: ABO/RH(D): A POS

## 2024-04-02 MED ORDER — METHOTREXATE FOR ECTOPIC PREGNANCY
50.0000 mg/m2 | Freq: Once | INTRAMUSCULAR | Status: AC
  Administered 2024-04-02: 82.5 mg via INTRAMUSCULAR
  Filled 2024-04-02: qty 3.3

## 2024-04-02 NOTE — ED Provider Notes (Signed)
-----------------------------------------   9:36 PM on 04/02/2024 ----------------------------------------- I have personally seen and evaluated the patient.  Patient has an IUD in place but has a positive pregnancy test with a beta-hCG of 155.  My examination patient has minimal left lower quadrant tenderness but no right lower quadrant tenderness.  Radiologist has called me regarding the ultrasound which shows a complex right adnexal lesion measuring 2.6 x 2 x 2 cm.  Given the positive beta-hCG this would be concerning for possible ectopic pregnancy.  Will discuss with OB/GYN for further workup and treatment.  Patient agreeable to plan.  OB had recommended methotrexate with repeat beta-hCG's in 4 days.  Patient appears well and otherwise appears safe for discharge home as she has close follow-up scheduled.   Dorothyann Drivers, MD 04/02/24 2249

## 2024-04-02 NOTE — ED Provider Notes (Signed)
 [facility] Provider Note    Event Date/Time   First MD Initiated Contact with Patient 04/02/24 2049     (approximate)   History   Vaginal Bleeding   HPI  Stacy Montes is a 28 y.o. female with IUD presents for evaluation of heavy vaginal bleeding on Tuesday.  Patient states that she took an at-home pregnancy test that was positive.  She reached out to her OB/GYN who recommended that she come to the emergency department for further evaluation.  LMP was 02/19/2024.  She states she is also having some lower abdominal cramping.  This is her first pregnancy, no history of miscarriage or ectopic pregnancies.      Physical Exam   Triage Vital Signs: ED Triage Vitals  Encounter Vitals Group     BP 04/02/24 1902 133/70     Girls Systolic BP Percentile --      Girls Diastolic BP Percentile --      Boys Systolic BP Percentile --      Boys Diastolic BP Percentile --      Pulse Rate 04/02/24 1902 (!) 102     Resp 04/02/24 1902 18     Temp 04/02/24 1902 98.2 F (36.8 C)     Temp Source 04/02/24 1902 Oral     SpO2 04/02/24 1902 100 %     Weight 04/02/24 1902 128 lb (58.1 kg)     Height 04/02/24 1902 5' 7 (1.702 m)     Head Circumference --      Peak Flow --      Pain Score 04/02/24 1905 2     Pain Loc --      Pain Education --      Exclude from Growth Chart --     Most recent vital signs: Vitals:   04/02/24 1902  BP: 133/70  Pulse: (!) 102  Resp: 18  Temp: 98.2 F (36.8 C)  SpO2: 100%    General: Awake, tearful. CV:  Good peripheral perfusion.  RRR Resp:  Normal effort.  CTAB Abd:  No distention.  Soft, mild tenderness to palpation across lower abdomen Other:     ED Results / Procedures / Treatments   Labs (all labs ordered are listed, but only abnormal results are displayed) Labs Reviewed  URINALYSIS, ROUTINE W REFLEX MICROSCOPIC - Abnormal; Notable for the following components:      Result Value   Color, Urine YELLOW (*)     APPearance CLEAR (*)    Hgb urine dipstick LARGE (*)    All other components within normal limits  HCG, QUANTITATIVE, PREGNANCY - Abnormal; Notable for the following components:   hCG, Beta Chain, Quant, S 155 (*)    All other components within normal limits  POC URINE PREG, ED - Abnormal; Notable for the following components:   Preg Test, Ur POSITIVE (*)    All other components within normal limits  CBC WITH DIFFERENTIAL/PLATELET  COMPREHENSIVE METABOLIC PANEL WITH GFR  ABO/RH    RADIOLOGY  OB ultrasound obtained, interpreted the images as well as reviewed the radiologist report which shows pregnancy of unknown location with IUD in place.  They do not identify an intrauterine gestational sac.  There is a complex right adnexal lesion measuring 2.6 x 2.0 x 2.0 cm without significant hypervascularity suspicious for early ectopic pregnancy given the positive beta-hCG.  PROCEDURES:  Critical Care performed: No  Procedures   MEDICATIONS ORDERED IN ED: Medications  methotrexate (for ectopic pregnancy) 25 mg/mL  chemo injection (82.5 mg Intramuscular Given 04/02/24 2322)     IMPRESSION / MDM / ASSESSMENT AND PLAN / ED COURSE  I reviewed the triage vital signs and the nursing notes.                             28 year old female presents for evaluation of positive pregnancy test with vaginal bleeding and IUD.  Patient was tachycardic on initial presentation but is understandably upset on exam.  Vital signs stable otherwise.  Differential diagnosis includes, but is not limited to, intrauterine pregnancy, ectopic pregnancy, threatened miscarriage, IUD misplaced.  Patient's presentation is most consistent with acute complicated illness / injury requiring diagnostic workup.  Given patient's IUD and positive pregnancy test greatest concern for ectopic pregnancy as IUD increases risk for ectopic.  Will obtain ultrasound.  Urine pregnancy test is positive, beta-hCG is 155 which is lower  than I would expect at [redacted] weeks pregnant.  Patient is A+ blood type so will not need RhoGAM.  CBC and CMP are unremarkable.  Urinalysis shows presence of hemoglobin otherwise normal.   Clinical Course as of 04/02/24 2331  Thu Apr 02, 2024  2116 US  OB LESS THAN 14 WEEKS WITH OB TRANSVAGINAL Findings concerning for ectopic pregnancy, will reach out to on call OB provider [LD]  2326 On-call midwife came to evaluate patient.  Recommended administration of methotrexate with close follow-up with her OB/GYN for repeat beta-hCG testing.  Her IUD can remain in place, plan discussed with patient who is in agreement.  She was given handouts on methotrexate and ectopic pregnancies. All questions were answered and she was stable at discharge. [LD]    Clinical Course User Index [LD] Cleaster Tinnie LABOR, PA-C     FINAL CLINICAL IMPRESSION(S) / ED DIAGNOSES   Final diagnoses:  Ectopic pregnancy without intrauterine pregnancy, unspecified location     Rx / DC Orders   ED Discharge Orders     None        Note:  This document was prepared using Dragon voice recognition software and may include unintentional dictation errors.   Cleaster Tinnie LABOR, PA-C 04/02/24 2332    Dorothyann Drivers, MD 04/06/24 2225

## 2024-04-02 NOTE — Progress Notes (Signed)
 Pt seen in ED 11/13 with pregnancy of unknown location, methotrexate givne Beta 155, ordered placed for day 4 and day 7 beta Jinnie Cookey, CNM  [facility] 04/02/24  10:04 PM

## 2024-04-02 NOTE — Telephone Encounter (Signed)
 Patient left msg on triage requesting call back. Having irregular vaginal bleeding with IUD. Called her back and she stated usually no cycles with IUD, when she does have a cycle they last 3-4 days with a very light flow. Last cycle started last Wednesday 11/5 and it is still going on, has some clotting. At some point it lighten up, but flow started again. Denies changing 2 or more full pads in less than one hour. Advised to take a pregnancy test first thing in the morning to rule pregnancy out. 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 Advised irregular bleeding is normal with IUD, look out for heavy vag bleeding and/or severe pelvic pain and follow up as needed. Has annual scheduled on 04/27/24.

## 2024-04-02 NOTE — Discharge Instructions (Addendum)
 Your blood work was reassuring today.  Your ultrasound showed an ectopic pregnancy.  You were given medication while in the emergency department to stop development of the pregnancy and to induce miscarriage.  It is important that you follow-up for repeat beta-hCG testing 4 days from now and 7 days from now. I have attached information for you to review.

## 2024-04-02 NOTE — Consult Note (Signed)
 Obstetrics & Gynecology Consult H&P   Consulting Department: OB GYN   Consulting Physician:L Errika Narvaiz, CNM   Consulting Question: Pregnancy of unknown location with IUD in place    History of Present Illness: Patient is a 28 y.o. G1P0000.  She has been experiencing vaginal bleeding since November 4. Today she has flooded through a super tampon twice today. She has a Kyleena  IUD, placed in 2022. She called the office, she was instructed to take a pregnancy test, it was positive so she presented to the ED. She had no intentions of being pregnant. She is having pain on her left lower abdomen. She is here with her partner and best friend. Pt's partner  with questions about future fertility.   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 Challenging placement by Dr. Arloa / CHRISTELLA Ro CNM. Follow up planned in 2 wks.    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    Past Surgical History:  Past Surgical History:  Procedure Laterality Date   TONSILLECTOMY AND ADENOIDECTOMY Bilateral 05/04/2020   Procedure: TONSILLECTOMY AND ADENOIDECTOMY;  Surgeon: Milissa Hamming, MD;  Location: [facility];  Service: ENT;  Laterality: Bilateral;  NO ADENOIDS SENT FOR SPECIMEN ONLY CAUTERIZED   WISDOM TOOTH EXTRACTION  05/2013    Gynecologic History:   Obstetric History: G1P0000  Family History:  Family History  Problem Relation Age of Onset   Diabetes Maternal Grandmother        TYPE 2    Social History:  Social History   Socioeconomic History   Marital status: Single    Spouse name: Not on file   Number of children: 0   Years of education: Not on file   Highest education level: Not on file  Occupational History   Not on file  Tobacco Use   Smoking status:  Never   Smokeless tobacco: Never  Vaping Use   Vaping status: Never Used  Substance and Sexual Activity   Alcohol use: Yes    Comment: rare   Drug use: No   Sexual activity: Not Currently    Birth control/protection: I.U.D.    Comment: Kyleena   Other Topics Concern   Not on file  Social History Narrative   Not on file   Social Drivers of Health   Financial Resource Strain: Not on file  Food Insecurity: Not on file  Transportation Needs: Not on file  Physical Activity: Sufficiently Active (04/22/2019)   Exercise Vital Sign    Days of Exercise per Week: 7 days    Minutes of Exercise per Session: 60 min  Stress: No Stress Concern Present (04/22/2019)   [employer] of Occupational Health - Occupational Stress Questionnaire    Feeling of Stress : Only a little  Social Connections: Moderately Integrated (04/22/2019)   Social Connection and Isolation Panel    Frequency of Communication with Friends and Family: More than three times a week    Frequency of Social Gatherings with Friends and Family: More than three times a week    Attends Religious Services: More than 4 times per year    Active Member of [employer] or Organizations: Yes    Attends Banker Meetings: Never    Marital Status: Never married  Intimate Partner Violence: Not At Risk (04/22/2019)  Humiliation, Afraid, Rape, and Kick questionnaire    Fear of Current or Ex-Partner: No    Emotionally Abused: No    Physically Abused: No    Sexually Abused: No    Allergies:  Allergies  Allergen Reactions   Lexapro  [Escitalopram  Oxalate] Swelling    Facial swelling   Monistat 1 [Tioconazole] Itching    Medications: Prior to Admission medications   Medication Sig Start Date End Date Taking? Authorizing Provider  levonorgestrel  (KYLEENA ) 19.5 MG IUD by Intrauterine route once.    [provider]  metroNIDAZOLE  (FLAGYL ) 500 MG tablet Take 1 tablet (500 mg total) by mouth 2 (two) times daily. Patient  not taking: Reported on 03/08/2024 12/20/22   Janit Alm Agent, MD  sertraline  (ZOLOFT ) 100 MG tablet Take 1 tablet (100 mg total) by mouth daily. Patient not taking: Reported on 03/08/2024 11/26/22   Copland, Bernarda NOVAK, PA-C    Physical Exam Vitals: Blood pressure 133/70, pulse (!) 102, temperature 98.2 F (36.8 C), temperature source Oral, resp. rate 18, height 5' 7 (1.702 m), weight 58.1 kg, last menstrual period 02/19/2024, SpO2 100%. General: NAD HEENT: normocephalic, anicteric Pulmonary: No increased work of breathing Cardiovascular: RRR, distal pulses 2+ Abdomen: flat, not distended, appears comfortable but reports tenderness when left lower abdomen palpated.  Genitourinary: deferred  Extremities: no edema, erythema, or tenderness Neurologic: Grossly intact Psychiatric: mood appropriate, affect full  Labs: Results for orders placed or performed during the hospital encounter of 04/02/24 (from the past 72 hours)  CBC with Differential/Platelet     Status: None   Collection Time: 04/02/24  7:07 PM  Result Value Ref Range   WBC 8.2 4.0 - 10.5 K/uL   RBC 4.56 3.87 - 5.11 MIL/uL   Hemoglobin 13.4 12.0 - 15.0 g/dL   HCT 58.4 63.9 - 53.9 %   MCV 91.0 80.0 - 100.0 fL   MCH 29.4 26.0 - 34.0 pg   MCHC 32.3 30.0 - 36.0 g/dL   RDW 87.1 88.4 - 84.4 %   Platelets 340 150 - 400 K/uL   nRBC 0.0 0.0 - 0.2 %   Neutrophils Relative % 46 %   Neutro Abs 3.8 1.7 - 7.7 K/uL   Lymphocytes Relative 43 %   Lymphs Abs 3.5 0.7 - 4.0 K/uL   Monocytes Relative 7 %   Monocytes Absolute 0.6 0.1 - 1.0 K/uL   Eosinophils Relative 3 %   Eosinophils Absolute 0.2 0.0 - 0.5 K/uL   Basophils Relative 1 %   Basophils Absolute 0.1 0.0 - 0.1 K/uL   Immature Granulocytes 0 %   Abs Immature Granulocytes 0.01 0.00 - 0.07 K/uL    Comment: Performed at [facility], [address] [place], [place] [address]  Urinalysis, Routine w reflex microscopic -Urine, Clean Catch     Status: Abnormal   Collection  Time: 04/02/24  7:07 PM  Result Value Ref Range   Color, Urine YELLOW (A) YELLOW   APPearance CLEAR (A) CLEAR   Specific Gravity, Urine 1.023 1.005 - 1.030   pH 5.0 5.0 - 8.0   Glucose, UA NEGATIVE NEGATIVE mg/dL   Hgb urine dipstick LARGE (A) NEGATIVE   Bilirubin Urine NEGATIVE NEGATIVE   Ketones, ur NEGATIVE NEGATIVE mg/dL   Protein, ur NEGATIVE NEGATIVE mg/dL   Nitrite NEGATIVE NEGATIVE   Leukocytes,Ua NEGATIVE NEGATIVE   RBC / HPF 0-5 0 - 5 RBC/hpf   WBC, UA 0-5 0 - 5 WBC/hpf   Bacteria, UA NONE SEEN NONE SEEN  Squamous Epithelial / HPF 0-5 0 - 5 /HPF   Mucus PRESENT     Comment: Performed at [facility], [address] [place], [place] [address]  hCG, quantitative, pregnancy     Status: Abnormal   Collection Time: 04/02/24  7:07 PM  Result Value Ref Range   hCG, Beta Chain, Quant, S 155 (H) <5 mIU/mL    Comment:          GEST. AGE      CONC.  (mIU/mL)   <=1 WEEK        5 - 50     2 WEEKS       50 - 500     3 WEEKS       100 - 10,000     4 WEEKS     1,000 - 30,000     5 WEEKS     3,500 - 115,000   6-8 WEEKS     12,000 - 270,000    12 WEEKS     15,000 - 220,000        FEMALE AND NON-PREGNANT FEMALE:     LESS THAN 5 mIU/mL Performed at [facility], [address] [place], [place] [address]   ABO/Rh     Status: None   Collection Time: 04/02/24  7:07 PM  Result Value Ref Range   ABO/RH(D)      A POS Performed at [facility], [address] [place], [place] [address]   Comprehensive metabolic panel     Status: None   Collection Time: 04/02/24  7:07 PM  Result Value Ref Range   Sodium 139 135 - 145 mmol/L   Potassium 3.6 3.5 - 5.1 mmol/L   Chloride 103 98 - 111 mmol/L   CO2 22 22 - 32 mmol/L   Glucose, Bld 96 70 - 99 mg/dL    Comment: Glucose reference range applies only to samples taken after fasting for at least 8 hours.   BUN 17 6 - 20 mg/dL   Creatinine, Ser 9.29 0.44 - 1.00 mg/dL   Calcium 9.3 8.9 - 89.6 mg/dL   Total  Protein 7.6 6.5 - 8.1 g/dL   Albumin 4.7 3.5 - 5.0 g/dL   AST 18 15 - 41 U/L   ALT 20 0 - 44 U/L   Alkaline Phosphatase 54 38 - 126 U/L   Total Bilirubin 0.5 0.0 - 1.2 mg/dL   GFR, Estimated >39 >39 mL/min    Comment: (NOTE) Calculated using the CKD-EPI Creatinine Equation (2021)    Anion gap 14 5 - 15    Comment: Performed at [facility], [address] [place], [place] [address]  POC urine preg, ED     Status: Abnormal   Collection Time: 04/02/24  7:24 PM  Result Value Ref Range   Preg Test, Ur POSITIVE (A) NEGATIVE    Comment:        THE SENSITIVITY OF THIS METHODOLOGY IS >20 mIU/mL.     Imaging US  OB LESS THAN 14 WEEKS WITH OB TRANSVAGINAL Result Date: 04/02/2024 EXAM: ULTRASOUND FIRST TRIMESTER CLINICAL HISTORY: Vaginal bleeding with IUD in place and positive pregnancy test. TECHNIQUE: Transabdominal and Transvaginal first trimester obstetric pelvic duplex ultrasound was performed with real-time imaging, color flow Doppler imaging, and spectral analysis. COMPARISON: None provided. FINDINGS: UTERUS: The uterus is well visualized with an IUD in satisfactory position. No focal myometrial mass. GESTATIONAL SAC(S): No intrauterine gestational sac is identified. RIGHT OVARY: Right ovary appears within  normal limits. Adjacent to the right ovary, there is a somewhat complex area identified which measures 2.6 x 2.0 x 2.0 cm. No significant increased vascularity is identified. Given the known positive beta hCG test, this may represent an early ectopic. Normal arterial and venous flow to the right ovary. LEFT OVARY: Unremarkable. Normal arterial and venous flow. FREE FLUID: No free fluid. IMPRESSION: 1. Pregnancy of unknown location with IUD in place. No intrauterine gestational sac identified. 2. Complex right adnexal lesion measuring 2.6 x 2.0 x 2.0 cm without significant hypervascularity, suspicious for early ectopic pregnancy given positive beta hCG. These findings were called to  Dr. Dorothyann at the time of exam interpretation. Electronically signed by: Oneil Devonshire MD 04/02/2024 09:08 PM EST RP Workstation: [id number]    Assessment: 28 y.o. G1P0000 with pregnancy of unknown location   Plan: -Methotrexate ordered -IUD may remain in  -reviewed risks/benefits and side effects, aware to avoid Folic acid and NSAIDS. Addressed concerns about future pregnancies. She may be at higher risk of ectopic pregnancy in the future.  -pt confirms she has the ability for follow up on day 4 and day 7 -front desk messaged to schedule lab only visits on day 4 and 7, pt instructed to call to scheduled lab only appointments as well.  -warning signs reviewed by  Doughtery PA   Jinnie Cookey, CNM  [facility] 04/03/2024 9:48 PM

## 2024-04-02 NOTE — ED Triage Notes (Signed)
 Patient ambulatory to triage with complaints of heavy vaginal bleeding since Tuesday. Patient states she took an at home pregnancy test that was positive. Currently has IUD with irregular menses. LMP known 02/19/24.

## 2024-04-03 ENCOUNTER — Telehealth: Payer: Self-pay | Admitting: Licensed Practical Nurse

## 2024-04-03 NOTE — Telephone Encounter (Signed)
 Reached out to pt to schedule two lab appointments on Monday, Nov. 17 and Thursday, Nov. 20.  Left message for pt to call back to schedule.

## 2024-04-03 NOTE — ED Notes (Signed)
 PT in no acute distress prior to discharge. Discharged instructions reviewed, all questions answered and pt verbalized understanding at this time. Pt has all belongings with them at time of discharge.

## 2024-04-06 ENCOUNTER — Other Ambulatory Visit

## 2024-04-06 DIAGNOSIS — O3680X0 Pregnancy with inconclusive fetal viability, not applicable or unspecified: Secondary | ICD-10-CM

## 2024-04-06 LAB — BETA HCG QUANT (REF LAB): hCG Quant: 27 m[IU]/mL

## 2024-04-09 ENCOUNTER — Other Ambulatory Visit

## 2024-04-09 ENCOUNTER — Other Ambulatory Visit: Payer: Self-pay | Admitting: Licensed Practical Nurse

## 2024-04-10 ENCOUNTER — Other Ambulatory Visit: Payer: Self-pay

## 2024-04-10 DIAGNOSIS — O3680X0 Pregnancy with inconclusive fetal viability, not applicable or unspecified: Secondary | ICD-10-CM

## 2024-04-10 LAB — BETA HCG QUANT (REF LAB): hCG Quant: 9 m[IU]/mL

## 2024-04-14 ENCOUNTER — Ambulatory Visit: Payer: Self-pay | Admitting: Licensed Practical Nurse

## 2024-04-15 ENCOUNTER — Other Ambulatory Visit: Payer: Self-pay

## 2024-04-15 ENCOUNTER — Other Ambulatory Visit

## 2024-04-15 DIAGNOSIS — O3680X0 Pregnancy with inconclusive fetal viability, not applicable or unspecified: Secondary | ICD-10-CM

## 2024-04-16 LAB — BETA HCG QUANT (REF LAB): hCG Quant: 1 m[IU]/mL

## 2024-04-18 ENCOUNTER — Ambulatory Visit: Payer: Self-pay | Admitting: Certified Nurse Midwife

## 2024-04-26 NOTE — Progress Notes (Unsigned)
 PCP:  [facility]   No chief complaint on file.    HPI:      [name] is a 28 y.o. G0P0000 who LMP was Patient's last menstrual period was 02/19/2024 (exact date)., presents today for her annual examination.  Her menses are monthly, lasting 4-5 days, light flow, no BTB, mild dysmen. LMP was a little heavier than usual but still mod flow.  Normal CBC 10/20  Sex activity: currently sexually active--contraception IUD. Kyleena  placed 05/31/20.  Had bleeding during sex (no pain) 4 days in a row a few wks ago that then resolved. Hadn't been sex active in awhile since beau out of [place]. Would like STD testing. Stopped OCPs in past due to decreased libido.  Last Pap: 09/13/22 Results were NILM/neg HPV DNA; 05/27/20 and 04/22/19 Results were LGSIL Hx of STDs: none  There is no FH of breast cancer. There is no FH of ovarian cancer. The patient does self-breast exams.  Tobacco use: The patient denies current or previous tobacco use. Alcohol use: occas No drug use.  Exercise: very active  She does get adequate calcium but not Vitamin D in her diet. Thinks she had Gardasil vaccine.  Hx of anxiety for several yrs, but worse last yr. Hx of fidgeting with hair and scalp picking to the point of bleeding, with worry, occas vomiting due to anxiety, occas panic attacks, irritability, feels numb sometimes. 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 Exercises daily and feels better during exercise. Sometimes has trouble sleeping, but other times sleep well. No SI. Started on lexapro  4/22 but had allergic rxn. Changed to zoloft  and now on 100 mg dose with sx improvement, no side effects. Also seeing therapist. Doing much better.  FH anxiety in mom/MGM, mom on lexapro  with sx relief.   Hx of fatigue for years, sleeping ~7hrs nightly. Sometimes with dizziness. Normal CBC 10/20.   Patient Active Problem List   Diagnosis Date Noted   Excoriation (skin-picking) disorder  02/20/2021   Anxiety and depression 01/02/2021   IUD contraception 05/31/2020   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    Past Surgical History:  Procedure Laterality Date   TONSILLECTOMY AND ADENOIDECTOMY Bilateral 05/04/2020   Procedure: TONSILLECTOMY AND ADENOIDECTOMY;  Surgeon: Milissa Hamming, MD;  Location: [facility];  Service: ENT;  Laterality: Bilateral;  NO ADENOIDS SENT FOR SPECIMEN ONLY CAUTERIZED   WISDOM TOOTH EXTRACTION  05/2013    Family History  Problem Relation Age of Onset   Diabetes Maternal Grandmother        TYPE 2    Social History   Socioeconomic History   Marital status: Single    Spouse name: Not on file   Number of children: 0   Years of education: Not on file   Highest education level: Not on file  Occupational History   Not on file  Tobacco Use   Smoking status: Never   Smokeless tobacco: Never  Vaping Use   Vaping status: Never Used  Substance and Sexual Activity   Alcohol use: Yes    Comment: rare   Drug use: No   Sexual activity: Not Currently    Birth control/protection: I.U.D.    Comment: Kyleena   Other Topics Concern   Not on file  Social History Narrative   Not on file   Social Drivers of Health   Financial Resource Strain: Not on file  Food Insecurity: Not on file  Transportation Needs: Not on file  Physical Activity: Sufficiently Active (04/22/2019)   Exercise Vital Sign    Days of Exercise per Week: 7 days    Minutes of Exercise per Session: 60 min  Stress: No Stress Concern Present (04/22/2019)   [employer] of Occupational Health - Occupational Stress Questionnaire    Feeling of Stress : Only a little  Social Connections: Moderately Integrated (04/22/2019)   Social Connection and Isolation Panel    Frequency of Communication with Friends and Family: More than three times a week    Frequency of Social Gatherings with Friends and Family: More than three times a week    Attends Religious Services: More  than 4 times per year    Active Member of [employer] or Organizations: Yes    Attends Banker Meetings: Never    Marital Status: Never married  Intimate Partner Violence: Not At Risk (04/22/2019)   Humiliation, Afraid, Rape, and Kick questionnaire    Fear of Current or Ex-Partner: No    Emotionally Abused: No    Physically Abused: No    Sexually Abused: No     Current Outpatient Medications:    levonorgestrel  (KYLEENA ) 19.5 MG IUD, by Intrauterine route once., Disp: , Rfl:    metroNIDAZOLE  (FLAGYL ) 500 MG tablet, Take 1 tablet (500 mg total) by mouth 2 (two) times daily. (Patient not taking: Reported on 03/08/2024), Disp: 14 tablet, Rfl: 0   sertraline  (ZOLOFT ) 100 MG tablet, Take 1 tablet (100 mg total) by mouth daily. (Patient not taking: Reported on 03/08/2024), Disp: 90 tablet, Rfl: 2     ROS:  Review of Systems  Constitutional:  Positive for fatigue. Negative for fever and unexpected weight change.  Respiratory:  Negative for cough, shortness of breath and wheezing.   Cardiovascular:  Negative for chest pain, palpitations and leg swelling.  Gastrointestinal:  Negative for blood in stool, constipation, diarrhea, nausea and vomiting.  Endocrine: Negative for cold intolerance, heat intolerance and polyuria.  Genitourinary:  Negative for dyspareunia, dysuria, flank pain, frequency, genital sores, hematuria, menstrual problem, pelvic pain, urgency, vaginal bleeding, vaginal discharge and vaginal pain.  Musculoskeletal:  Negative for back pain, joint swelling and myalgias.  Skin:  Negative for rash.  Neurological:  Positive for dizziness. Negative for syncope, light-headedness, numbness and headaches.  Hematological:  Negative for adenopathy.  Psychiatric/Behavioral:  Positive for dysphoric mood. Negative for agitation, confusion, sleep disturbance and suicidal ideas. The patient is not nervous/anxious.    BREAST: No symptoms   Objective: LMP 02/19/2024 (Exact Date)     Physical Exam Constitutional:      Appearance: She is well-developed.  Genitourinary:     Vulva normal.     Right Labia: No rash, tenderness or lesions.    Left Labia: No tenderness, lesions or rash.    No vaginal discharge, erythema or tenderness.      Right Adnexa: not tender and no mass present.    Left Adnexa: not tender and no mass present.    No cervical friability or polyp.     IUD strings visualized.     Uterus is not enlarged or tender.  Breasts:    Right: No mass, nipple discharge, skin change or tenderness.     Left: No mass, nipple discharge, skin change or tenderness.  Neck:     Thyroid: No thyromegaly.  Cardiovascular:     Rate and Rhythm: Normal rate and regular rhythm.     Heart sounds: Normal heart sounds. No murmur heard. Pulmonary:     Effort:  Pulmonary effort is normal.     Breath sounds: Normal breath sounds.  Abdominal:     Palpations: Abdomen is soft.     Tenderness: There is no abdominal tenderness. There is no guarding or rebound.  Musculoskeletal:        General: Normal range of motion.     Cervical back: Normal range of motion.  Lymphadenopathy:     Cervical: No cervical adenopathy.  Neurological:     General: No focal deficit present.     Mental Status: She is alert and oriented to person, place, and time.     Cranial Nerves: No cranial nerve deficit.  Skin:    General: Skin is warm and dry.  Psychiatric:        Mood and Affect: Mood normal.        Behavior: Behavior normal.        Thought Content: Thought content normal.        Judgment: Judgment normal.  Vitals reviewed.       05/29/2021    3:36 PM 03/28/2021   11:09 AM 02/20/2021   11:26 AM 01/02/2021    3:59 PM  GAD 7 : Generalized Anxiety Score  Nervous, Anxious, on Edge 1 1 3 3   Control/stop worrying 0 1 3 3   Worry too much - different things 1 1 3 3   Trouble relaxing 0 1 3 3   Restless 0 0 0 2  Easily annoyed or irritable 1 2 2 3   Afraid - awful might happen 0 0 0 0   Total GAD 7 Score 3 6 14 17   Anxiety Difficulty Somewhat difficult Not difficult at all Somewhat difficult Very difficult       05/29/2021    3:35 PM  Depression screen PHQ 2/9  Decreased Interest 0  Down, Depressed, Hopeless 1  PHQ - 2 Score 1  Altered sleeping 0  Tired, decreased energy 1  Change in appetite 0  Feeling bad or failure about yourself  0  Trouble concentrating 0  Moving slowly or fidgety/restless 1  Suicidal thoughts 0  PHQ-9 Score 3   Difficult doing work/chores Somewhat difficult     Data saved with a previous flowsheet row definition    Assessment/Plan: Encounter for annual routine gynecological examination  Cervical cancer screening - Plan: Cytology - PAP   Screening for STD (sexually transmitted disease) - Plan: HIV Antibody (routine testing w rflx), RPR, Hepatitis C antibody, Cytology - PAP  Screening for HPV (human papillomavirus) - Plan: Cytology - PAP  LGSIL on Pap smear of cervix - Plan: Cytology - PAP, repeat pap today. Will f/u with results.  Encounter for routine checking of intrauterine contraceptive device (IUD); IUD strings in cx os; due for removal 1/27  Anxiety and depression - Plan: sertraline  (ZOLOFT ) 100 MG tablet; sx improved, doing well. Rx RF. F/u prn. Cont seeing therapist.  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 Will f/u with results. Increase Vit D, may need more sleep if labs WNL.   No orders of the defined types were placed in this encounter.    GYN counsel adequate intake of calcium and vitamin D, diet and exercise,      F/U  No follow-ups on file.  Pasha Gadison B. Amberly Livas, PA-C 04/26/2024 7:29 PM

## 2024-04-27 ENCOUNTER — Ambulatory Visit: Admitting: Obstetrics and Gynecology

## 2024-04-27 ENCOUNTER — Encounter: Payer: Self-pay | Admitting: Obstetrics and Gynecology

## 2024-04-27 VITALS — BP 106/59 | HR 84 | Ht 68.0 in | Wt 135.0 lb

## 2024-04-27 DIAGNOSIS — F633 Trichotillomania: Secondary | ICD-10-CM

## 2024-04-27 DIAGNOSIS — F32A Depression, unspecified: Secondary | ICD-10-CM

## 2024-04-27 DIAGNOSIS — Z01419 Encounter for gynecological examination (general) (routine) without abnormal findings: Secondary | ICD-10-CM

## 2024-04-27 DIAGNOSIS — Z30431 Encounter for routine checking of intrauterine contraceptive device: Secondary | ICD-10-CM

## 2024-04-27 NOTE — Patient Instructions (Signed)
 I value your feedback and you entrusting [place] with your care. If you get a [place] patient survey, I would appreciate you taking the time to let us know about your experience today. Thank you! ? ? ?

## 2024-05-04 ENCOUNTER — Encounter: Payer: 59 | Admitting: Dermatology

## 2024-05-28 ENCOUNTER — Ambulatory Visit
